# Patient Record
Sex: Female | Born: 1985 | Hispanic: Yes | Marital: Married | Smoking: Never smoker
Health system: Southern US, Community
[De-identification: ages and names within clinical notes are randomized; demographics above are authoritative.]

## PROBLEM LIST (undated history)

## (undated) DIAGNOSIS — N83209 Unspecified ovarian cyst, unspecified side: Secondary | ICD-10-CM

## (undated) DIAGNOSIS — T148XXA Other injury of unspecified body region, initial encounter: Secondary | ICD-10-CM

## (undated) HISTORY — PX: NO PAST SURGERIES: SHX2092

## (undated) HISTORY — DX: Unspecified ovarian cyst, unspecified side: N83.209

## (undated) HISTORY — DX: Other injury of unspecified body region, initial encounter: T14.8XXA

---
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
## 2005-02-17 ENCOUNTER — Emergency Department: Payer: Self-pay | Admitting: Unknown Physician Specialty

## 2006-08-24 ENCOUNTER — Emergency Department: Payer: Self-pay | Admitting: Emergency Medicine

## 2007-06-21 ENCOUNTER — Emergency Department: Payer: Self-pay | Admitting: Emergency Medicine

## 2008-05-26 ENCOUNTER — Encounter: Payer: Self-pay | Admitting: Family Medicine

## 2008-05-29 ENCOUNTER — Encounter: Payer: Self-pay | Admitting: Family Medicine

## 2008-06-05 ENCOUNTER — Observation Stay: Payer: Self-pay | Admitting: Obstetrics and Gynecology

## 2011-08-22 ENCOUNTER — Emergency Department: Payer: Self-pay | Admitting: Internal Medicine

## 2011-09-02 ENCOUNTER — Emergency Department: Payer: Self-pay | Admitting: Unknown Physician Specialty

## 2012-05-01 ENCOUNTER — Emergency Department: Payer: Self-pay | Admitting: Emergency Medicine

## 2012-05-01 LAB — URINALYSIS, COMPLETE
Bilirubin,UR: NEGATIVE
Glucose,UR: NEGATIVE mg/dL (ref 0–75)
Ketone: NEGATIVE
Nitrite: NEGATIVE
Ph: 5 (ref 4.5–8.0)
RBC,UR: <1 /HPF (ref 0–5)
Squamous Epithelial: 3
WBC UR: 2 /HPF (ref 0–5)

## 2012-05-01 LAB — CBC WITH DIFFERENTIAL/PLATELET
Basophil %: 0.3 %
Eosinophil #: 0.2 10*3/uL (ref 0.0–0.7)
HCT: 39.8 % (ref 35.0–47.0)
HGB: 13.5 g/dL (ref 12.0–16.0)
Lymphocyte #: 2.2 10*3/uL (ref 1.0–3.6)
Lymphocyte %: 29.7 %
MCHC: 33.9 g/dL (ref 32.0–36.0)
MCV: 92 fL (ref 80–100)
Monocyte #: 0.6 x10 3/mm (ref 0.2–0.9)
Monocyte %: 8.1 %
Neutrophil #: 4.4 10*3/uL (ref 1.4–6.5)
RBC: 4.31 10*6/uL (ref 3.80–5.20)

## 2012-05-01 LAB — COMPREHENSIVE METABOLIC PANEL
Albumin: 4.1 g/dL (ref 3.4–5.0)
Alkaline Phosphatase: 81 U/L (ref 50–136)
Anion Gap: 8 (ref 7–16)
Bilirubin,Total: 0.3 mg/dL (ref 0.2–1.0)
Chloride: 107 mmol/L (ref 98–107)
Co2: 24 mmol/L (ref 21–32)
Creatinine: 0.49 mg/dL — ABNORMAL LOW (ref 0.60–1.30)
EGFR (African American): >60
EGFR (Non-African Amer.): >60
Sodium: 139 mmol/L (ref 136–145)
Total Protein: 8.2 g/dL (ref 6.4–8.2)

## 2012-05-01 LAB — LIPASE, BLOOD: Lipase: 374 U/L (ref 73–393)

## 2012-05-01 LAB — PREGNANCY, URINE: Pregnancy Test, Urine: NEGATIVE m[IU]/mL

## 2012-05-09 ENCOUNTER — Emergency Department: Payer: Self-pay | Admitting: Orthopedic Surgery

## 2012-05-09 LAB — URINALYSIS, COMPLETE
Bilirubin,UR: NEGATIVE
Blood: NEGATIVE
Nitrite: NEGATIVE
Ph: 5 (ref 4.5–8.0)
Protein: NEGATIVE
Squamous Epithelial: 5

## 2012-09-07 ENCOUNTER — Emergency Department: Payer: Self-pay | Admitting: Emergency Medicine

## 2012-09-07 LAB — URINALYSIS, COMPLETE
Glucose,UR: NEGATIVE mg/dL (ref 0–75)
Leukocyte Esterase: NEGATIVE
Nitrite: NEGATIVE
Ph: 5 (ref 4.5–8.0)
Protein: NEGATIVE
RBC,UR: <1 /HPF (ref 0–5)
Specific Gravity: 1.011 (ref 1.003–1.030)
Squamous Epithelial: 1

## 2012-09-07 LAB — CBC
HGB: 13.4 g/dL (ref 12.0–16.0)
MCH: 31.1 pg (ref 26.0–34.0)
MCV: 93 fL (ref 80–100)
Platelet: 275 10*3/uL (ref 150–440)
RBC: 4.32 10*6/uL (ref 3.80–5.20)

## 2012-09-12 ENCOUNTER — Emergency Department: Payer: Self-pay | Admitting: Emergency Medicine

## 2012-09-12 LAB — URINALYSIS, COMPLETE
Bacteria: NONE SEEN
Bilirubin,UR: NEGATIVE
Glucose,UR: NEGATIVE mg/dL (ref 0–75)
Nitrite: NEGATIVE
Ph: 6 (ref 4.5–8.0)
Protein: NEGATIVE
RBC,UR: 15 /HPF (ref 0–5)
Specific Gravity: 1.016 (ref 1.003–1.030)
Squamous Epithelial: 2
WBC UR: 1 /HPF (ref 0–5)

## 2012-09-12 LAB — CBC
HGB: 13.7 g/dL (ref 12.0–16.0)
MCH: 31.3 pg (ref 26.0–34.0)
MCHC: 33.8 g/dL (ref 32.0–36.0)
MCV: 93 fL (ref 80–100)
RDW: 13.5 % (ref 11.5–14.5)

## 2013-04-07 ENCOUNTER — Emergency Department: Payer: Self-pay | Admitting: Emergency Medicine

## 2013-04-07 LAB — URINALYSIS, COMPLETE
Glucose,UR: NEGATIVE mg/dL (ref 0–75)
Nitrite: NEGATIVE
Ph: 5 (ref 4.5–8.0)
Squamous Epithelial: 3
WBC UR: 1 /HPF (ref 0–5)

## 2013-04-07 LAB — CBC: MCV: 92 fL (ref 80–100)

## 2013-04-07 LAB — COMPREHENSIVE METABOLIC PANEL
Albumin: 3.9 g/dL (ref 3.4–5.0)
Alkaline Phosphatase: 73 U/L (ref 50–136)
Anion Gap: 6 — ABNORMAL LOW (ref 7–16)
BUN: 8 mg/dL (ref 7–18)
Bilirubin,Total: 0.4 mg/dL (ref 0.2–1.0)
Calcium, Total: 8.8 mg/dL (ref 8.5–10.1)
Chloride: 105 mmol/L (ref 98–107)
Co2: 24 mmol/L (ref 21–32)
Potassium: 3.7 mmol/L (ref 3.5–5.1)
SGOT(AST): 14 U/L — ABNORMAL LOW (ref 15–37)
SGPT (ALT): 18 U/L (ref 12–78)
Sodium: 135 mmol/L — ABNORMAL LOW (ref 136–145)
Total Protein: 8.2 g/dL (ref 6.4–8.2)

## 2013-04-07 LAB — HCG, QUANTITATIVE, PREGNANCY: Beta Hcg, Quant.: 29669 m[IU]/mL — ABNORMAL HIGH

## 2013-07-03 ENCOUNTER — Emergency Department: Payer: Self-pay | Admitting: Emergency Medicine

## 2013-10-18 ENCOUNTER — Encounter: Payer: Self-pay | Admitting: Family Medicine

## 2013-10-27 ENCOUNTER — Encounter: Payer: Self-pay | Admitting: Family Medicine

## 2016-08-07 ENCOUNTER — Emergency Department
Admission: EM | Admit: 2016-08-07 | Discharge: 2016-08-07 | Disposition: A | Discharge status: Home / Self Care | Payer: Self-pay | Attending: Emergency Medicine | Admitting: Emergency Medicine

## 2016-08-07 ENCOUNTER — Encounter: Payer: Self-pay | Admitting: Emergency Medicine

## 2016-08-07 DIAGNOSIS — M62830 Muscle spasm of back: Secondary | ICD-10-CM | POA: Insufficient documentation

## 2016-08-07 MED ORDER — KETOROLAC TROMETHAMINE 30 MG/ML IJ SOLN
30.0000 mg | Freq: Once | INTRAMUSCULAR | Status: AC
  Administered 2016-08-07: 30 mg via INTRAMUSCULAR

## 2016-08-07 MED ORDER — ORPHENADRINE CITRATE 30 MG/ML IJ SOLN
INTRAMUSCULAR | Status: AC
  Administered 2016-08-07: 60 mg via INTRAMUSCULAR
  Filled 2016-08-07: qty 2

## 2016-08-07 MED ORDER — CYCLOBENZAPRINE HCL 10 MG PO TABS: 10.0000 mg | ORAL_TABLET | Freq: Three times a day (TID) | ORAL | 0 refills | Status: DC | PRN

## 2016-08-07 MED ORDER — NAPROXEN 500 MG PO TABS: 500.0000 mg | ORAL_TABLET | Freq: Two times a day (BID) | ORAL | 0 refills | Status: DC

## 2016-08-07 MED ORDER — ORPHENADRINE CITRATE 30 MG/ML IJ SOLN
60.0000 mg | Freq: Two times a day (BID) | INTRAMUSCULAR | Status: DC
  Administered 2016-08-07: 60 mg via INTRAMUSCULAR

## 2016-08-07 MED ORDER — KETOROLAC TROMETHAMINE 30 MG/ML IJ SOLN
INTRAMUSCULAR | Status: AC
  Administered 2016-08-07: 30 mg via INTRAMUSCULAR
  Filled 2016-08-07: qty 1

## 2016-08-07 NOTE — ED Triage Notes (Signed)
Pt c/o right lower back pain since this am; took Nyquil and Ibuprofen with no relief; denies injury; denies urinary s/s; nothing improves pain

## 2016-08-07 NOTE — ED Provider Notes (Signed)
[facility] Emergency Department Provider Note  ____________________________________________  Time seen: Approximately 8:23 PM  I have reviewed the triage vital signs and the nursing notes.   HISTORY  Chief Complaint Back Pain    HPI Shelby Montgomery is a 30 y.o. female , NAD, presents to the emergency department with several hour history of right middle back pain. States the pain began after she went to a laundromat to dry her clothing. States she went home and attempted to take a shower to alleviate the pain but states that he continue to worsen. Has applied over-the-counter icy hot cream and taken ibuprofen without relief. Has no radiation of the pain. Denies any saddle paresthesias or loss of bowel or bladder control. Has no numbness, wheeze, tingling. Denies abdominal pain, nausea, vomiting, dysuria, hematuria or changes in urinary or bowel habits. Has not noted any rashes or skin sores. Denies any injury, trauma or falls.   History reviewed. No pertinent past medical history.  There are no active problems to display for this patient.   History reviewed. No pertinent surgical history.  Prior to Admission medications   Medication Sig Start Date End Date Taking? Authorizing Provider  cyclobenzaprine (FLEXERIL) 10 MG tablet Take 1 tablet (10 mg total) by mouth 3 (three) times daily as needed for muscle spasms. 08/07/16   Levoy Geisen L Dnasia Gauna, PA-C  naproxen (NAPROSYN) 500 MG tablet Take 1 tablet (500 mg total) by mouth 2 (two) times daily with a meal. 08/07/16   Evon Lopezperez L Kodie Kishi, PA-C    Allergies Patient has no known allergies.  History reviewed. No pertinent family history.  Social History Social History  Substance Use Topics  . Smoking status: Never Smoker  . Smokeless tobacco: Never Used  . Alcohol use No     Review of Systems  Constitutional: No fever/chills Cardiovascular: No chest pain. Respiratory: No shortness of breath.  Gastrointestinal:  No abdominal pain.  No nausea, vomiting.  No diarrhea.  No constipation. Genitourinary: Negative for dysuria, hematuria. No urinary hesitancy, urgency or increased frequency. Musculoskeletal: Positive for right middle back pain without radiation.  Skin: Negative for rash, redness, swelling, bruising, skin sores. Neurological: Negative for numbness, weakness, tingling. No saddle paresthesias or loss of bowel or bladder control. 10-point ROS otherwise negative.  ____________________________________________   PHYSICAL EXAM:  VITAL SIGNS: ED Triage Vitals  Enc Vitals Group     BP 08/07/16 2010 132/88     Pulse Rate 08/07/16 2010 92     Resp 08/07/16 2010 18     Temp 08/07/16 2010 98.3 F (36.8 C)     Temp Source 08/07/16 2010 Oral     SpO2 08/07/16 2010 99 %     Weight 08/07/16 2011 130 lb (59 kg)     Height 08/07/16 2011 5\' 1"  (1.549 m)     Head Circumference --      Peak Flow --      Pain Score 08/07/16 2010 8     Pain Loc --      Pain Edu? --      Excl. in GC? --      Constitutional: Alert and oriented. Well appearing and in no acute distress but in pain. Eyes: Conjunctivae are normal.  Head: Atraumatic. Neck: No cervical spine tenderness to palpation. Supple with full range of motion. No trapezial muscle spasm. Hematological/Lymphatic/Immunilogical: No cervical lymphadenopathy. Cardiovascular: Normal rate, regular rhythm. Normal S1 and S2.  Good peripheral circulation. Respiratory: Normal respiratory effort without tachypnea or retractions.  Lungs CTAB with breath sounds noted in all lung fields. No wheeze, rhonchi, rales. Gastrointestinal: Soft and nontender without distention or guarding in all quadrants. Musculoskeletal: No central spinal tenderness about the thoracic, lumbar or sacral spinal regions. Tenderness to palpation about the right lateral, lower thoracic muscular region with mild muscle spasm noted. Range of motion of the back is decreased due to pain. Full range  of motion of bilateral upper and lower extremities without pain or difficulty. Neurologic:  Normal speech and language. No gross focal neurologic deficits are appreciated.  Skin:  Skin is warm, dry and intact. No rash, redness, swelling, bruising, skin sores noted. Psychiatric: Mood and affect are normal. Speech and behavior are normal. Patient exhibits appropriate insight and judgement.   ____________________________________________   LABS  None ____________________________________________  EKG  None ____________________________________________  RADIOLOGY  None ____________________________________________    PROCEDURES  Procedure(s) performed: None   Procedures   Medications  orphenadrine (NORFLEX) injection 60 mg (60 mg Intramuscular Given 08/07/16 2048)  ketorolac (TORADOL) 30 MG/ML injection 30 mg (30 mg Intramuscular Given 08/07/16 2052)     ____________________________________________   INITIAL IMPRESSION / ASSESSMENT AND PLAN / ED COURSE  Pertinent labs & imaging results that were available during my care of the patient were reviewed by me and considered in my medical decision making (see chart for details).  Clinical Course     Patient's diagnosis is consistent with Spasm of thoracic back muscle. Patient was given IM Toradol and Norflex while in the emergency department and tolerated well with notable decrease in pain. Patient will be discharged home with prescriptions for Flexeril and Naprosyn to take as directed. Patient is to follow up with [facility] if symptoms persist past this treatment course. Patient is given ED precautions to return to the ED for any worsening or new symptoms.    ____________________________________________  FINAL CLINICAL IMPRESSION(S) / ED DIAGNOSES  Final diagnoses:  Spasm of thoracic back muscle      NEW MEDICATIONS STARTED DURING THIS VISIT:  New Prescriptions   CYCLOBENZAPRINE (FLEXERIL) 10 MG TABLET     Take 1 tablet (10 mg total) by mouth 3 (three) times daily as needed for muscle spasms.   NAPROXEN (NAPROSYN) 500 MG TABLET    Take 1 tablet (500 mg total) by mouth 2 (two) times daily with a meal.         Hope PigeonJami L Laverne Hursey, PA-C 08/07/16 2150    Emily FilbertJonathan E Williams, MD 08/07/16 2220

## 2016-08-07 NOTE — ED Notes (Addendum)
Patient with complaint of right lower back pain that started this morning but became worse after doing laundry this afternoon.

## 2019-12-16 ENCOUNTER — Emergency Department: Payer: 59

## 2019-12-16 ENCOUNTER — Other Ambulatory Visit: Payer: Self-pay

## 2019-12-16 ENCOUNTER — Emergency Department
Admission: EM | Admit: 2019-12-16 | Discharge: 2019-12-16 | Disposition: A | Discharge status: Home / Self Care | Payer: 59 | Attending: Emergency Medicine | Admitting: Emergency Medicine

## 2019-12-16 DIAGNOSIS — R102 Pelvic and perineal pain: Secondary | ICD-10-CM | POA: Diagnosis not present

## 2019-12-16 DIAGNOSIS — R109 Unspecified abdominal pain: Secondary | ICD-10-CM | POA: Diagnosis present

## 2019-12-16 DIAGNOSIS — N83202 Unspecified ovarian cyst, left side: Secondary | ICD-10-CM | POA: Diagnosis not present

## 2019-12-16 LAB — CBC
HCT: 39.2 % (ref 36.0–46.0)
Hemoglobin: 13.1 g/dL (ref 12.0–15.0)
MCH: 31.4 pg (ref 26.0–34.0)
MCHC: 33.4 g/dL (ref 30.0–36.0)
MCV: 94 fL (ref 80.0–100.0)
Platelets: 275 10*3/uL (ref 150–400)
RBC: 4.17 MIL/uL (ref 3.87–5.11)
RDW: 13.1 % (ref 11.5–15.5)
WBC: 7.3 10*3/uL (ref 4.0–10.5)
nRBC: 0 % (ref 0.0–0.2)

## 2019-12-16 LAB — URINALYSIS, COMPLETE (UACMP) WITH MICROSCOPIC
Bilirubin Urine: NEGATIVE
Glucose, UA: NEGATIVE mg/dL
Hgb urine dipstick: NEGATIVE
Ketones, ur: NEGATIVE mg/dL
Leukocytes,Ua: NEGATIVE
Nitrite: NEGATIVE
Protein, ur: NEGATIVE mg/dL
Specific Gravity, Urine: 1.025 (ref 1.005–1.030)
pH: 5 (ref 5.0–8.0)

## 2019-12-16 LAB — COMPREHENSIVE METABOLIC PANEL
ALT: 15 U/L (ref 0–44)
AST: 18 U/L (ref 15–41)
Albumin: 4.1 g/dL (ref 3.5–5.0)
Alkaline Phosphatase: 58 U/L (ref 38–126)
Anion gap: 8 (ref 5–15)
BUN: 10 mg/dL (ref 6–20)
CO2: 21 mmol/L — ABNORMAL LOW (ref 22–32)
Calcium: 9.1 mg/dL (ref 8.9–10.3)
Chloride: 108 mmol/L (ref 98–111)
Creatinine, Ser: 0.62 mg/dL (ref 0.44–1.00)
GFR calc Af Amer: >60 mL/min (ref >60)
GFR calc non Af Amer: >60 mL/min (ref >60)
Glucose, Bld: 97 mg/dL (ref 70–99)
Potassium: 3.9 mmol/L (ref 3.5–5.1)
Sodium: 137 mmol/L (ref 135–145)
Total Bilirubin: 0.6 mg/dL (ref 0.3–1.2)
Total Protein: 7.5 g/dL (ref 6.5–8.1)

## 2019-12-16 LAB — POCT PREGNANCY, URINE: Preg Test, Ur: NEGATIVE

## 2019-12-16 LAB — LIPASE, BLOOD: Lipase: 31 U/L (ref 11–51)

## 2019-12-16 MED ORDER — KETOROLAC TROMETHAMINE 30 MG/ML IJ SOLN
30.0000 mg | Freq: Once | INTRAMUSCULAR | Status: AC
  Administered 2019-12-16: 19:00:00 30 mg via INTRAVENOUS
  Filled 2019-12-16: qty 1

## 2019-12-16 MED ORDER — FENTANYL CITRATE (PF) 100 MCG/2ML IJ SOLN
50.0000 ug | Freq: Once | INTRAMUSCULAR | Status: DC
  Filled 2019-12-16: qty 2

## 2019-12-16 MED ORDER — KETOROLAC TROMETHAMINE 10 MG PO TABS: 10.0000 mg | ORAL_TABLET | Freq: Three times a day (TID) | ORAL | 0 refills | Status: DC | PRN

## 2019-12-16 MED ORDER — SODIUM CHLORIDE 0.9% FLUSH: 3.0000 mL | Freq: Once | INTRAVENOUS | Status: DC

## 2019-12-16 MED ORDER — IOHEXOL 300 MG/ML  SOLN
100.0000 mL | Freq: Once | INTRAMUSCULAR | Status: AC | PRN
  Administered 2019-12-16: 100 mL via INTRAVENOUS
  Filled 2019-12-16: qty 100

## 2019-12-16 MED ORDER — FENTANYL CITRATE (PF) 100 MCG/2ML IJ SOLN
50.0000 ug | Freq: Once | INTRAMUSCULAR | Status: AC
  Administered 2019-12-16: 50 ug via INTRAVENOUS

## 2019-12-16 NOTE — ED Notes (Signed)
Pt in US at this time 

## 2019-12-16 NOTE — ED Triage Notes (Addendum)
Pt comes via POV from home with c/o left sided abdominal pain. Pt states this started yesterday morning.  Pt denies any N/V/D. Pt denies any urinary symptoms.

## 2019-12-16 NOTE — Discharge Instructions (Addendum)
Please seek medical attention for any high fevers, chest pain, shortness of breath, change in behavior, persistent vomiting, bloody stool or any other new or concerning symptoms.  

## 2019-12-16 NOTE — ED Provider Notes (Signed)
[facility] Emergency Department Provider Note   ____________________________________________   I have reviewed the triage vital signs and the nursing notes.   HISTORY  Chief Complaint Abdominal Pain   History limited by: Not Limited   HPI Shelby Montgomery is a 34 y.o. female who presents to the emergency department today because of concerns for left sided abdominal pain.  Patient states that the pain started yesterday morning.  The pain has been fairly constant since then.  It does somewhat come and go in waves.  She has not had any associated fevers, nausea or vomiting.  She has not noticed any abnormal vaginal discharge, change in urination or defecation.  The patient denies similar symptoms in the past.  Patient denies any history of abdominal surgeries.   Records reviewed. Per medical record review patient has a history of cesarean section.  History reviewed. No pertinent past medical history.  There are no problems to display for this patient.   History reviewed. No pertinent surgical history.  Prior to Admission medications   Medication Sig Start Date End Date Taking? Authorizing Provider  cyclobenzaprine (FLEXERIL) 10 MG tablet Take 1 tablet (10 mg total) by mouth 3 (three) times daily as needed for muscle spasms. 08/07/16   Hagler, Jami L, PA-C  naproxen (NAPROSYN) 500 MG tablet Take 1 tablet (500 mg total) by mouth 2 (two) times daily with a meal. 08/07/16   Hagler, Jami L, PA-C    Allergies Patient has no known allergies.  No family history on file.  Social History Social History   Tobacco Use  . Smoking status: Never Smoker  . Smokeless tobacco: Never Used  Substance Use Topics  . Alcohol use: No  . Drug use: No    Review of Systems Constitutional: No fever/chills Eyes: No visual changes. ENT: No sore throat. Cardiovascular: Denies chest pain. Respiratory: Denies shortness of breath. Gastrointestinal: Positive for left  sided abdominal pain.  Genitourinary: Negative for dysuria. Musculoskeletal: Negative for back pain. Skin: Negative for rash. Neurological: Negative for headaches, focal weakness or numbness.  ____________________________________________   PHYSICAL EXAM:  VITAL SIGNS: ED Triage Vitals  Enc Vitals Group     BP 12/16/19 1157 133/86     Pulse Rate 12/16/19 1157 82     Resp 12/16/19 1157 18     Temp 12/16/19 1157 98.4 F (36.9 C)     Temp src --      SpO2 12/16/19 1157 100 %     Weight 12/16/19 1158 130 lb (59 kg)     Height 12/16/19 1158 5\' 1"  (1.549 m)     Head Circumference --      Peak Flow --      Pain Score 12/16/19 1158 6   Constitutional: Alert and oriented.  Eyes: Conjunctivae are normal.  ENT      Head: Normocephalic and atraumatic.      Nose: No congestion/rhinnorhea.      Mouth/Throat: Mucous membranes are moist.      Neck: No stridor. Hematological/Lymphatic/Immunilogical: No cervical lymphadenopathy. Cardiovascular: Normal rate, regular rhythm.  No murmurs, rubs, or gallops.  Respiratory: Normal respiratory effort without tachypnea nor retractions. Breath sounds are clear and equal bilaterally. No wheezes/rales/rhonchi. Gastrointestinal: Soft and tender to palpation in the left upper and lower abdomen Genitourinary: Deferred Musculoskeletal: Normal range of motion in all extremities. No lower extremity edema. Neurologic:  Normal speech and language. No gross focal neurologic deficits are appreciated.  Skin:  Skin is warm, dry and  intact. No rash noted. Psychiatric: Mood and affect are normal. Speech and behavior are normal. Patient exhibits appropriate insight and judgment.  ____________________________________________    LABS (pertinent positives/negatives)  Lipase 31 CMP wnl except co2 21 CBC wbc 7.3, hgb 13.1, plt 275 UA cloudy, few bacteria, 0-5 rbc and wbc Upreg  negative ____________________________________________   EKG  None  ____________________________________________    RADIOLOGY  CT abd pel Left sided ovarian cyst, no other acute abnormality  US pelvis Left sided ovarian cyst  ____________________________________________   PROCEDURES  Procedures  ____________________________________________   INITIAL IMPRESSION / ASSESSMENT AND PLAN / ED COURSE  Pertinent labs & imaging results that were available during my care of the patient were reviewed by me and considered in my medical decision making (see chart for details).   Patient presented to the emergency department today because of concerns for left-sided abdominal pain that started yesterday morning.  Blood work here without any concerning leukocytosis.  Urine without findings consistent with significant fractional.  Did obtain a CT given that her tenderness was all on her left side.  This did not show any concerning abdominal infection although showed an ovarian cyst.  Ultrasound was performed which confirmed left-sided ovarian cyst but no signs of torsion or other complicating factors.  Patient did feel better with pain medication given here in the emergency department.  At this point I think is reasonable to discharge patient home.  Will give patient prescription for pain medication.  Discussed importance of follow-up.  ____________________________________________   FINAL CLINICAL IMPRESSION(S) / ED DIAGNOSES  Final diagnoses:  Left sided abdominal pain  Cyst of left ovary     Note: This dictation was prepared with Dragon dictation. Any transcriptional errors that result from this process are unintentional     Phineas Semen, MD 12/16/19 [phone]

## 2020-03-05 ENCOUNTER — Other Ambulatory Visit: Payer: Self-pay

## 2020-03-05 ENCOUNTER — Emergency Department: Payer: No Typology Code available for payment source

## 2020-03-05 ENCOUNTER — Encounter: Payer: Self-pay | Admitting: Emergency Medicine

## 2020-03-05 ENCOUNTER — Emergency Department
Admission: EM | Admit: 2020-03-05 | Discharge: 2020-03-05 | Disposition: A | Discharge status: Home / Self Care | Payer: No Typology Code available for payment source | Attending: Emergency Medicine | Admitting: Emergency Medicine

## 2020-03-05 DIAGNOSIS — Y9389 Activity, other specified: Secondary | ICD-10-CM | POA: Diagnosis not present

## 2020-03-05 DIAGNOSIS — S6991XA Unspecified injury of right wrist, hand and finger(s), initial encounter: Secondary | ICD-10-CM | POA: Diagnosis present

## 2020-03-05 DIAGNOSIS — Y990 Civilian activity done for income or pay: Secondary | ICD-10-CM | POA: Diagnosis not present

## 2020-03-05 DIAGNOSIS — W3182XA Contact with other commercial machinery, initial encounter: Secondary | ICD-10-CM | POA: Insufficient documentation

## 2020-03-05 DIAGNOSIS — S61001A Unspecified open wound of right thumb without damage to nail, initial encounter: Secondary | ICD-10-CM | POA: Diagnosis not present

## 2020-03-05 DIAGNOSIS — M79644 Pain in right finger(s): Secondary | ICD-10-CM

## 2020-03-05 DIAGNOSIS — Y9289 Other specified places as the place of occurrence of the external cause: Secondary | ICD-10-CM | POA: Insufficient documentation

## 2020-03-05 DIAGNOSIS — M7989 Other specified soft tissue disorders: Secondary | ICD-10-CM

## 2020-03-05 MED ORDER — TETANUS-DIPHTH-ACELL PERTUSSIS 5-2.5-18.5 LF-MCG/0.5 IM SUSP
0.5000 mL | Freq: Once | INTRAMUSCULAR | Status: AC
  Administered 2020-03-05: 0.5 mL via INTRAMUSCULAR
  Filled 2020-03-05: qty 0.5

## 2020-03-05 MED ORDER — OXYCODONE-ACETAMINOPHEN 5-325 MG PO TABS
1.0000 | ORAL_TABLET | ORAL | Status: AC | PRN
  Administered 2020-03-05 (×2): 1 via ORAL
  Filled 2020-03-05 (×2): qty 1

## 2020-03-05 MED ORDER — BACITRACIN-NEOMYCIN-POLYMYXIN 400-5-5000 EX OINT
TOPICAL_OINTMENT | Freq: Once | CUTANEOUS | Status: AC
  Filled 2020-03-05: qty 1

## 2020-03-05 MED ORDER — HYDROCODONE-ACETAMINOPHEN 5-325 MG PO TABS: 1.0000 | ORAL_TABLET | ORAL | 0 refills | Status: DC | PRN

## 2020-03-05 NOTE — ED Triage Notes (Signed)
Pt reports got her hand stuck in a machine at work, now painful with laceration to right hand thumb and pain to hand.

## 2020-03-05 NOTE — Discharge Instructions (Addendum)
You were seen today for right thumb pain, swelling and skin avulsion secondary to trauma.  You received a tetanus injection today.  Your x-ray was negative for acute fracture  We cleansed your wound and applied triple antibiotic ointment, and placed you in a splint for comfort.  Please keep the wound clean and dry.  You may apply triple antibiotic ointment daily.  Please follow-up with your PCP for reevaluation tomorrow if able.

## 2020-03-05 NOTE — ED Notes (Signed)
Pt unable to sign E-signature due to signature pad malfunction. Pt verbalized understanding of d/c instructions and had no additional questions or concerns for this RN or provider. Pt left with d/c instructions and gathered all personal belongings from room and removed them prior to ED departure.   

## 2020-03-05 NOTE — ED Provider Notes (Addendum)
[facility] Emergency Department Provider Note ____________________________________________  Time seen: 1800  I have reviewed the triage vital signs and the nursing notes.  HISTORY  Chief Complaint  Hand Injury   HPI Shelby Montgomery is a 34 y.o. female presents to the ER with complaint of right thumb pain/laceration.  She reports she was at work earlier and got her thumb caught in the machine.  She describes the pain as throbbing, pressure.  The pain does not radiate but she does have numbness and weakness of her right thumb. She denies tingling. She initially presented to [facility] and was advised to present to the ER.  History reviewed. No pertinent past medical history.  There are no problems to display for this patient.   History reviewed. No pertinent surgical history.  Prior to Admission medications   Medication Sig Start Date End Date Taking? Authorizing Provider  cyclobenzaprine (FLEXERIL) 10 MG tablet Take 1 tablet (10 mg total) by mouth 3 (three) times daily as needed for muscle spasms. 08/07/16   Hagler, Jami L, PA-C  HYDROcodone-acetaminophen (NORCO/VICODIN) 5-325 MG tablet Take 1 tablet by mouth every 4 (four) hours as needed for moderate pain. 03/05/20 03/05/21  Lorre Munroe, NP  ketorolac (TORADOL) 10 MG tablet Take 1 tablet (10 mg total) by mouth every 8 (eight) hours as needed for severe pain. 12/16/19   Phineas Semen, MD  naproxen (NAPROSYN) 500 MG tablet Take 1 tablet (500 mg total) by mouth 2 (two) times daily with a meal. 08/07/16   Hagler, Jami L, PA-C    Allergies Patient has no known allergies.  No family history on file.  Social History Social History   Tobacco Use  . Smoking status: Never Smoker  . Smokeless tobacco: Never Used  Substance Use Topics  . Alcohol use: No  . Drug use: No    Review of Systems  Constitutional: Negative for fever, chills or body aches. Cardiovascular: Negative for chest pain or chest  tightness. Respiratory: Negative for cough or shortness of breath. Musculoskeletal: Positive for right thumb pain, swelling. Skin: Positive for laceration right thumb. Neurological: Positive for numbness and weakness of right thumb. Negative for headaches, tingling. ____________________________________________  PHYSICAL EXAM:  VITAL SIGNS: ED Triage Vitals  Enc Vitals Group     BP 03/05/20 1548 (!) 133/100     Pulse Rate 03/05/20 1548 78     Resp 03/05/20 1548 18     Temp 03/05/20 1548 98.7 F (37.1 C)     Temp Source 03/05/20 1548 Oral     SpO2 03/05/20 1548 100 %     Weight 03/05/20 1543 145 lb (65.8 kg)     Height 03/05/20 1543 5\' 1"  (1.549 m)     Head Circumference --      Peak Flow --      Pain Score 03/05/20 1543 8     Pain Loc --      Pain Edu? --      Excl. in GC? --     Constitutional: Alert and oriented. Appears in pain but in no distress. Cardiovascular: Normal rate, regular rhythm.  Radial pulses 2+ bilaterally. Unable to check cap refill secondary to artificial nails. Respiratory: Normal respiratory effort. No wheezes/rales/rhonchi. Musculoskeletal: Decreased active flexion and extension of the right thumb secondary to pain.  Normal passive flexion extension of the right thumb.  1+ swelling noted over the distal thumb.  Neurologic: Decreased sensation of the right thumb. Skin: Avulsed skin noted of  the right medial thumb. ____________________________________________   RADIOLOGY  Imaging Orders     DG Hand Complete Right IMPRESSION: No acute osseous abnormality   ____________________________________________   INITIAL IMPRESSION / ASSESSMENT AND PLAN / ED COURSE  Right Thumb Pain, Swelling, Skin Avulsion secondary to Trauma:  Percocet 5-325 mg PO x 1 Xray right thumb negative for acute fracture Skin avulsion unable to be repaired- cleansed with NS, covered with Triple Antibiotic Ointment, placed in splint for comfort Tdap today RX for Hydrocodone 5-325  mg tab TID prn She will follow up with her PCP tomorrow  I reviewed the patient's prescription history over the last 12 months in the multi-state controlled substances database(s) that includes [place], [place], [place], [place], [place], [place], [place], [place], [place], [place], [place], [place], [place], and [place].  Results were notable for no controlled substances. ____________________________________________  FINAL CLINICAL IMPRESSION(S) / ED DIAGNOSES  Final diagnoses:  Pain of right thumb  Swelling of right thumb  Avulsion of skin of right thumb, initial encounter       Lorre Munroe, NP 03/05/20 1833    Lorre Munroe, NP 03/05/20 1835    Lorre Munroe, NP 03/05/20 1846    Emily Filbert, MD 03/05/20 2015

## 2020-06-15 ENCOUNTER — Encounter: Payer: Self-pay | Admitting: Emergency Medicine

## 2020-06-15 ENCOUNTER — Emergency Department
Admission: EM | Admit: 2020-06-15 | Discharge: 2020-06-15 | Disposition: A | Discharge status: Home / Self Care | Payer: 59 | Attending: Emergency Medicine | Admitting: Emergency Medicine

## 2020-06-15 ENCOUNTER — Other Ambulatory Visit: Payer: Self-pay

## 2020-06-15 DIAGNOSIS — X58XXXA Exposure to other specified factors, initial encounter: Secondary | ICD-10-CM | POA: Insufficient documentation

## 2020-06-15 DIAGNOSIS — S3992XA Unspecified injury of lower back, initial encounter: Secondary | ICD-10-CM | POA: Diagnosis present

## 2020-06-15 DIAGNOSIS — S39012A Strain of muscle, fascia and tendon of lower back, initial encounter: Secondary | ICD-10-CM | POA: Diagnosis not present

## 2020-06-15 LAB — URINALYSIS, COMPLETE (UACMP) WITH MICROSCOPIC
Bacteria, UA: NONE SEEN
Bilirubin Urine: NEGATIVE
Glucose, UA: NEGATIVE mg/dL
Ketones, ur: NEGATIVE mg/dL
Leukocytes,Ua: NEGATIVE
Nitrite: NEGATIVE
Protein, ur: NEGATIVE mg/dL
Specific Gravity, Urine: 1.027 (ref 1.005–1.030)
pH: 5 (ref 5.0–8.0)

## 2020-06-15 LAB — POCT PREGNANCY, URINE: Preg Test, Ur: NEGATIVE

## 2020-06-15 MED ORDER — METHOCARBAMOL 500 MG PO TABS: 500.0000 mg | ORAL_TABLET | Freq: Four times a day (QID) | ORAL | 0 refills | Status: DC

## 2020-06-15 MED ORDER — ONDANSETRON 4 MG PO TBDP
4.0000 mg | ORAL_TABLET | Freq: Once | ORAL | Status: AC
  Administered 2020-06-15: 4 mg via ORAL
  Filled 2020-06-15: qty 1

## 2020-06-15 MED ORDER — HYDROCODONE-ACETAMINOPHEN 5-325 MG PO TABS: 1.0000 | ORAL_TABLET | Freq: Four times a day (QID) | ORAL | 0 refills | Status: DC | PRN

## 2020-06-15 MED ORDER — KETOROLAC TROMETHAMINE 30 MG/ML IJ SOLN
30.0000 mg | Freq: Once | INTRAMUSCULAR | Status: AC
  Administered 2020-06-15: 30 mg via INTRAMUSCULAR
  Filled 2020-06-15: qty 1

## 2020-06-15 MED ORDER — OXYCODONE-ACETAMINOPHEN 7.5-325 MG PO TABS
1.0000 | ORAL_TABLET | Freq: Once | ORAL | Status: AC
  Administered 2020-06-15: 1 via ORAL
  Filled 2020-06-15: qty 1

## 2020-06-15 MED ORDER — METHOCARBAMOL 500 MG PO TABS
1000.0000 mg | ORAL_TABLET | Freq: Once | ORAL | Status: AC
  Administered 2020-06-15: 1000 mg via ORAL
  Filled 2020-06-15: qty 2

## 2020-06-15 MED ORDER — NAPROXEN 500 MG PO TABS: 500.0000 mg | ORAL_TABLET | Freq: Two times a day (BID) | ORAL | 0 refills | Status: DC

## 2020-06-15 NOTE — Discharge Instructions (Signed)
Follow-up with your primary care provider if any continued problems or concerns.  Begin taking medication medication as directed.  Be aware that the hydrocodone and the methocarbamol could cause drowsiness and increase your risk for injury.  Do not drive or operate machinery while taking this medication.  Use ice or heat to your back as needed for discomfort.  If any severe worsening of your symptoms such as loss of bowel or bladder control return to the emergency department.

## 2020-06-15 NOTE — ED Provider Notes (Signed)
[facility] Emergency Department Provider Note  ____________________________________________   First MD Initiated Contact with Patient 06/15/20 [phone]     (approximate)  I have reviewed the triage vital signs and the nursing notes.   HISTORY  Chief Complaint Back Pain   HPI Shelby Montgomery is a 34 y.o. female presents to the ED with complaint of low back pain that started last evening.  Patient states that she mostly has pain on the right side which is worse than the left.  She is unaware of any injury.  She states that she had problems with her back 2 years ago which cleared with medication.  She is unaware of any urinary symptoms or history of kidney stones.      History reviewed. No pertinent past medical history.  There are no problems to display for this patient.   History reviewed. No pertinent surgical history.  Prior to Admission medications   Medication Sig Start Date End Date Taking? Authorizing Provider  HYDROcodone-acetaminophen (NORCO/VICODIN) 5-325 MG tablet Take 1 tablet by mouth every 6 (six) hours as needed for moderate pain. 06/15/20   Tommi Rumps, PA-C  methocarbamol (ROBAXIN) 500 MG tablet Take 1 tablet (500 mg total) by mouth 4 (four) times daily. 06/15/20   Tommi Rumps, PA-C  naproxen (NAPROSYN) 500 MG tablet Take 1 tablet (500 mg total) by mouth 2 (two) times daily with a meal. 06/15/20   Tommi Rumps, PA-C    Allergies Patient has no known allergies.  No family history on file.  Social History Social History   Tobacco Use  . Smoking status: Never Smoker  . Smokeless tobacco: Never Used  Substance Use Topics  . Alcohol use: No  . Drug use: No    Review of Systems Constitutional: No fever/chills Eyes: No visual changes. ENT: No sore throat. Cardiovascular: Denies chest pain. Respiratory: Denies shortness of breath. Gastrointestinal: No abdominal pain.  No nausea, no vomiting.  No diarrhea.  No  constipation. Genitourinary: Negative for dysuria. Musculoskeletal: Positive for low back pain. Skin: Negative for rash. Neurological: Negative for headaches, focal weakness or numbness. ____________________________________________   PHYSICAL EXAM:  VITAL SIGNS: ED Triage Vitals  Enc Vitals Group     BP 06/15/20 0730 130/90     Pulse Rate 06/15/20 0730 85     Resp 06/15/20 0730 16     Temp 06/15/20 0730 99 F (37.2 C)     Temp Source 06/15/20 0730 Oral     SpO2 06/15/20 0730 100 %     Weight 06/15/20 0731 145 lb 1 oz (65.8 kg)     Height 06/15/20 0731 5\' 1"  (1.549 m)     Head Circumference --      Peak Flow --      Pain Score 06/15/20 0730 10     Pain Loc --      Pain Edu? --      Excl. in GC? --     Constitutional: Alert and oriented. Well appearing and in no acute distress. Eyes: Conjunctivae are normal. PERRL. EOMI. Head: Atraumatic. Nose: No congestion/rhinnorhea. Neck: No stridor.  No cervical tenderness on palpation posteriorly.  Range of motion without restriction. Cardiovascular: Normal rate, regular rhythm. Grossly normal heart sounds.  Good peripheral circulation. Respiratory: Normal respiratory effort.  No retractions. Lungs CTAB. Gastrointestinal: Soft and nontender. No distention.  No CVA tenderness. Musculoskeletal: On examination of the back there is no tenderness on palpation of the thoracic or point tenderness  noted on the lumbar spine.  No step-offs noted.  There is moderate tenderness on the right SI joint and lower lumbar area paravertebral muscles on the right however left paravertebral muscles are also slightly tender to palpation.  Moderate muscle spasms are noted which restricts patient's range of motion.  Good muscle strength lower extremities. Neurologic:  Normal speech and language. No gross focal neurologic deficits are appreciated.  Skin:  Skin is warm, dry and intact. No rash noted. Psychiatric: Mood and affect are normal. Speech and behavior are  normal.  ____________________________________________   LABS (all labs ordered are listed, but only abnormal results are displayed)  Labs Reviewed  URINALYSIS, COMPLETE (UACMP) WITH MICROSCOPIC - Abnormal; Notable for the following components:      Result Value   Color, Urine YELLOW (*)    APPearance HAZY (*)    Hgb urine dipstick SMALL (*)    All other components within normal limits  POC URINE PREG, ED  POCT PREGNANCY, URINE     PROCEDURES  Procedure(s) performed (including Critical Care):  Procedures   ____________________________________________   INITIAL IMPRESSION / ASSESSMENT AND PLAN / ED COURSE  As part of my medical decision making, I reviewed the following data within the electronic MEDICAL RECORD NUMBER Notes from prior ED visits and [place] Controlled Substance Database  34 year old female presents to the ED with complaint of low back pain that started last evening.  Patient denies any recent injuries or situations where she has had to lift, push or pull.  She does report that she did have problems with her back 2 years ago which was similar to this.  She has never had back surgery.  She has no symptoms that are suggestive of cauda equina.  Patient was given Zofran and Percocet while waiting for her urinalysis.  She was made aware that her tests were negative for any kidney infection or suggestive for kidney stone.  Patient was given Toradol 30 mg IM and 1000 mg of methocarbamol p.o.  Prior to discharge patient was able to move much easier and was more comfortable.  She will remain on naproxen 500 mg twice daily with food, Norco every 6 hours as needed for pain and methocarbamol 1 tablet 4 times a day for his muscle spasms.  Patient is aware that she cannot take these medications while driving or operating machinery.  Note for work was given to her.  ____________________________________________   FINAL CLINICAL IMPRESSION(S) / ED DIAGNOSES  Final diagnoses:  Strain of  lumbar region, initial encounter     ED Discharge Orders         Ordered    methocarbamol (ROBAXIN) 500 MG tablet  4 times daily        06/15/20 1016    HYDROcodone-acetaminophen (NORCO/VICODIN) 5-325 MG tablet  Every 6 hours PRN        06/15/20 1016    naproxen (NAPROSYN) 500 MG tablet  2 times daily with meals        06/15/20 1016          *Please note:  Murrel Ruvid Sweeten was evaluated in Emergency Department on 06/15/2020 for the symptoms described in the history of present illness. She was evaluated in the context of the global COVID-19 pandemic, which necessitated consideration that the patient might be at risk for infection with the SARS-CoV-2 virus that causes COVID-19. Institutional protocols and algorithms that pertain to the evaluation of patients at risk for COVID-19 are in a state of rapid change  based on information released by regulatory bodies including the CDC and federal and state organizations. These policies and algorithms were followed during the patient's care in the ED.  Some ED evaluations and interventions may be delayed as a result of limited staffing during and the pandemic.*   Note:  This document was prepared using Dragon voice recognition software and may include unintentional dictation errors.    Tommi Rumps, PA-C 06/15/20 1343    Gilles Chiquito, MD 06/15/20 [phone]

## 2020-06-15 NOTE — ED Triage Notes (Signed)
C/O lower back pain since past night.  States right side hurts worse than left.  Denies injury.

## 2020-12-08 ENCOUNTER — Other Ambulatory Visit (HOSPITAL_COMMUNITY): Payer: Self-pay | Admitting: Physician Assistant

## 2020-12-08 DIAGNOSIS — R1031 Right lower quadrant pain: Secondary | ICD-10-CM

## 2021-01-05 ENCOUNTER — Other Ambulatory Visit: Payer: Self-pay

## 2021-01-05 ENCOUNTER — Ambulatory Visit
Admission: RE | Admit: 2021-01-05 | Discharge: 2021-01-05 | Disposition: A | Discharge status: Home / Self Care | Payer: BC Managed Care – PPO | Source: Ambulatory Visit | Attending: Physician Assistant | Admitting: Physician Assistant

## 2021-01-05 DIAGNOSIS — R1031 Right lower quadrant pain: Secondary | ICD-10-CM | POA: Diagnosis not present

## 2021-01-05 DIAGNOSIS — N83201 Unspecified ovarian cyst, right side: Secondary | ICD-10-CM | POA: Diagnosis not present

## 2021-02-24 DIAGNOSIS — H00019 Hordeolum externum unspecified eye, unspecified eyelid: Secondary | ICD-10-CM | POA: Diagnosis not present

## 2021-10-31 IMAGING — US US PELVIS COMPLETE WITH TRANSVAGINAL
1 series · 13 of 25 positions shown · non-contrast
Comparison: Prior CT from earlier the same day.

CLINICAL DATA: Initial evaluation for left-sided abdominal pain for
1 day.



[Series 1: us pelvic complete with transvaginal · 13 of 110 slices shown]
[im 1/110]
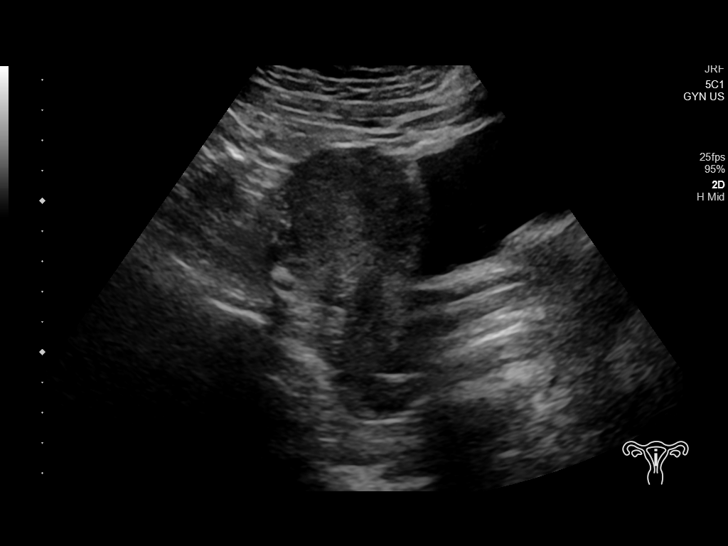
[im 10/110]
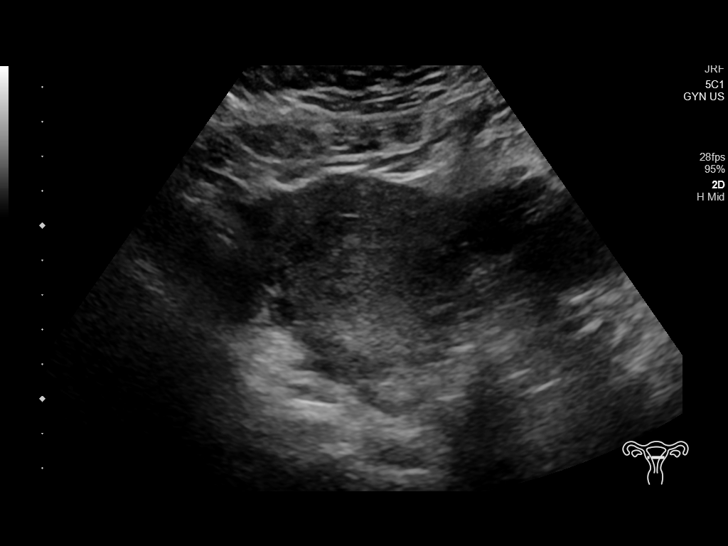
[im 19/110]
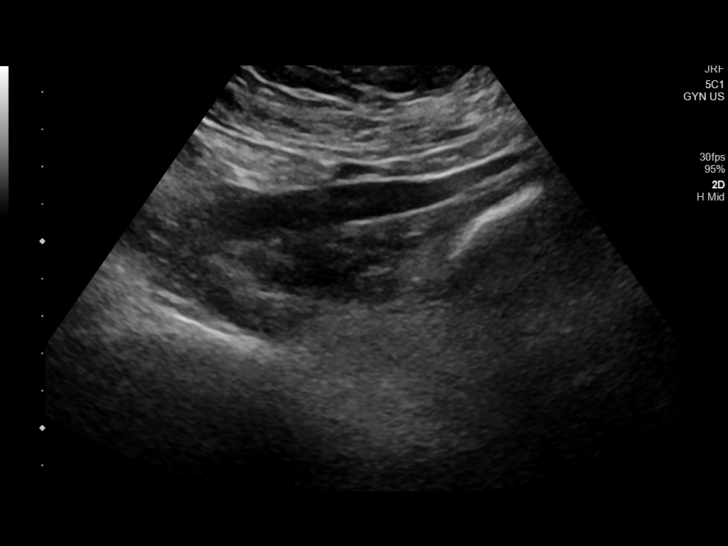
[im 28/110]
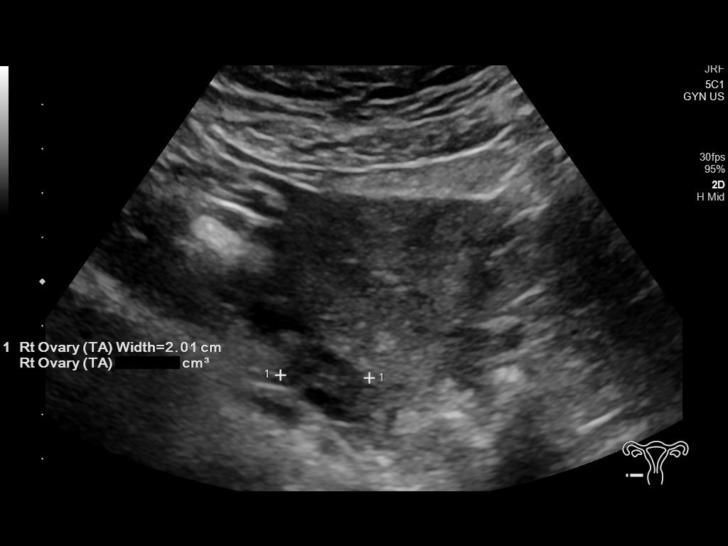
[im 37/110]
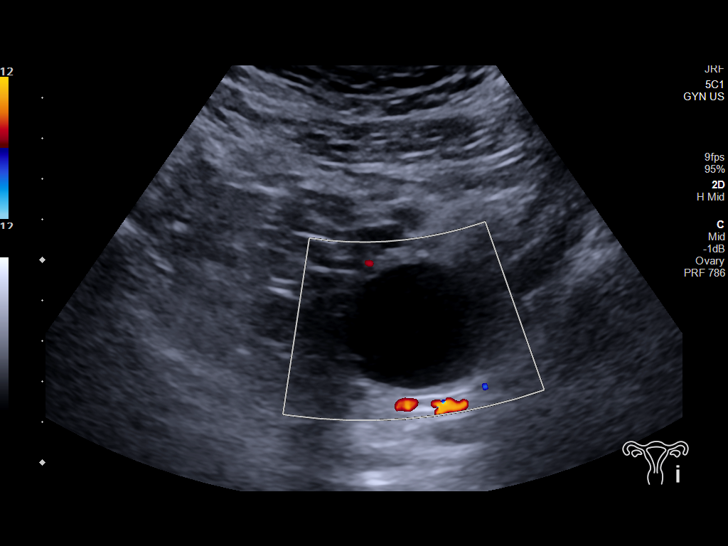
[im 46/110]
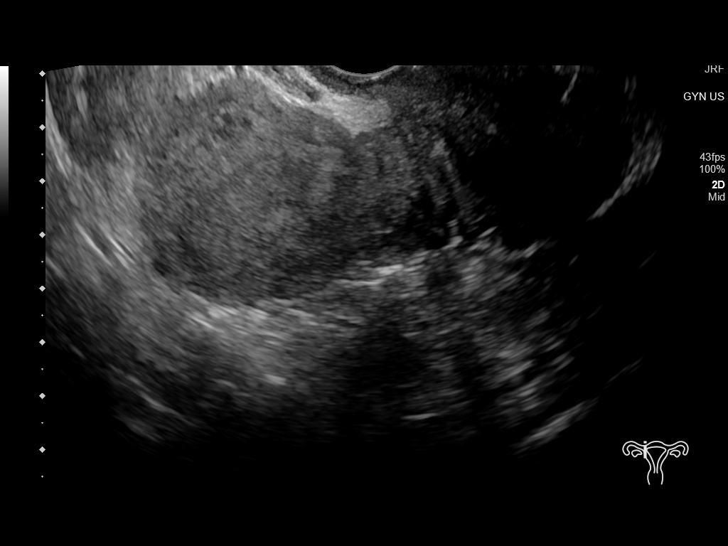
[im 55/110]
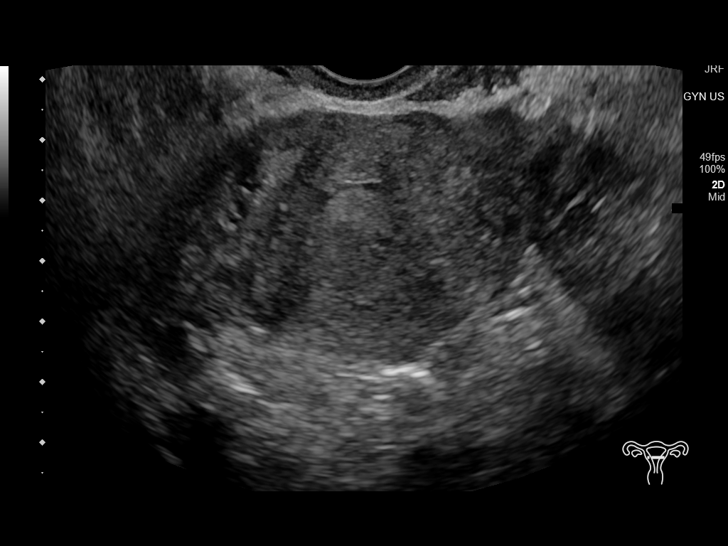
[im 64/110]
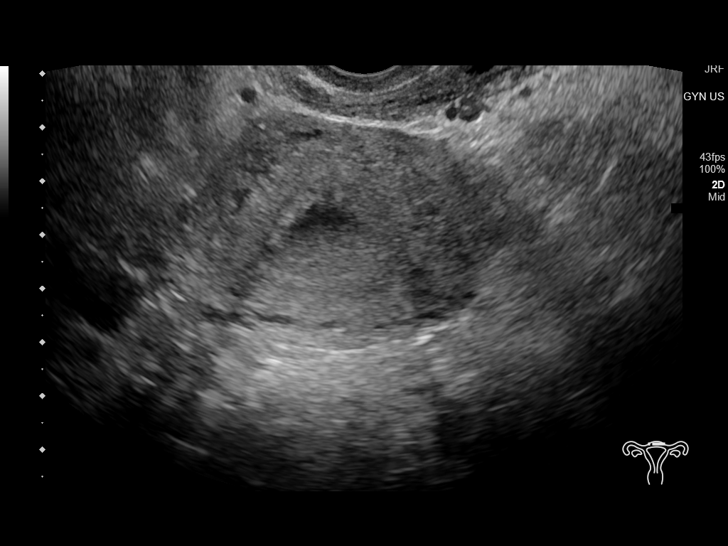
[im 73/110]
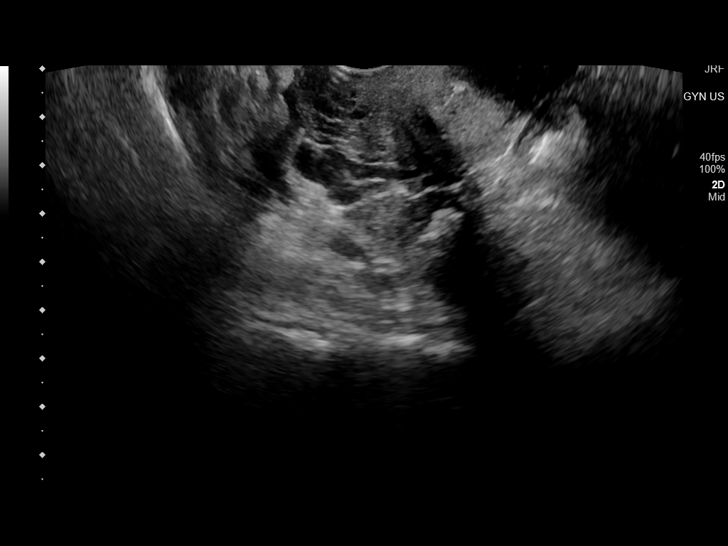
[im 82/110]
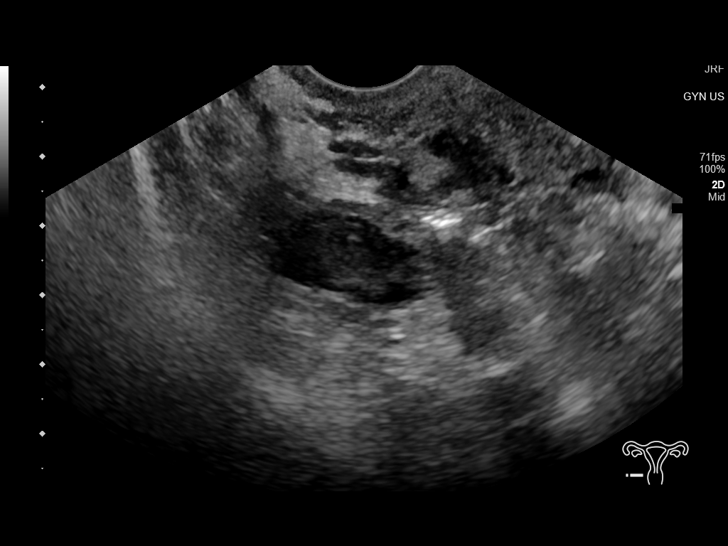
[im 91/110]
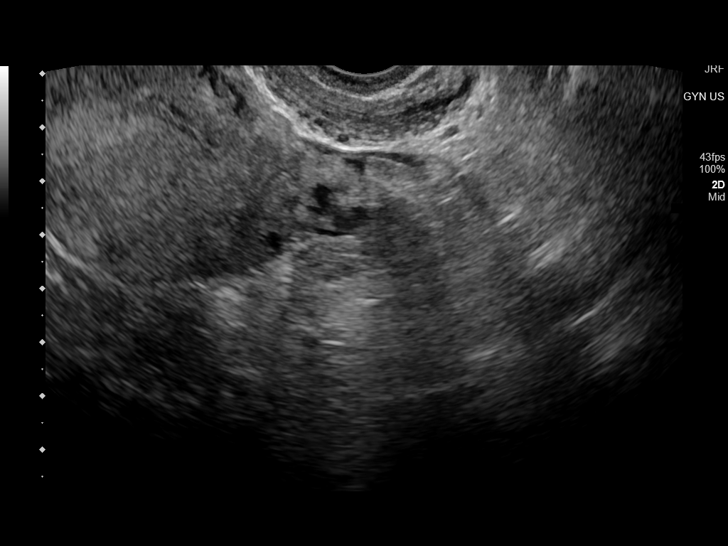
[im 100/110]
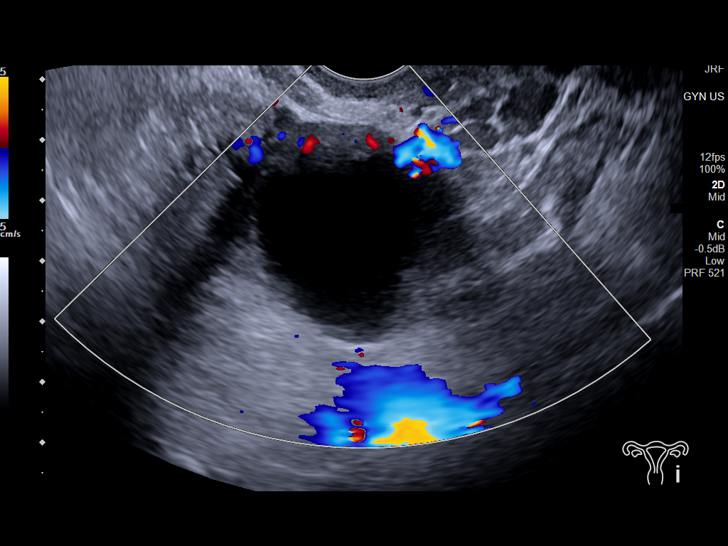
[im 110/110]
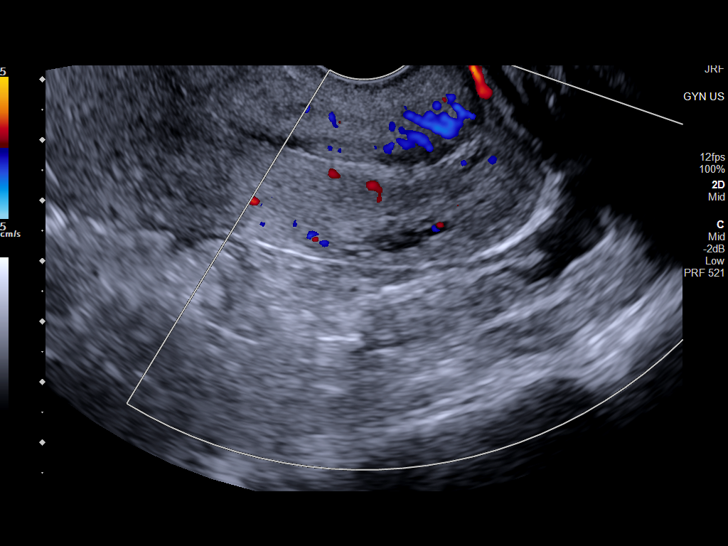

[13 of 25 positions shown; findings below may reference images not displayed]

FINDINGS: Uterus

Measurements: 10.1 x 5.0 x 5.7 cm = volume: 52 mL. No fibroids or
other mass visualized.

Endometrium

Thickness: 9.1 mm. No focal abnormality visualized. Small amount of
simple anechoic fluid noted within the endometrial cavity.

Right ovary

Measurements: 2.7 x 1.5 x 2.3 cm = volume: 5.0 mL. Normal
appearance/no adnexal mass.

Left ovary

Measurements: 4.0 x 3.2 x 3.4 cm = volume: 23 mL. 2.9 x 2.8 x 2.9 cm
simple cyst. No internal complexity, vascularity, or solid
component.

Other findings

Small volume free fluid within the pelvis, presumably physiologic.
IMPRESSION: 1. 3 cm simple left ovarian cyst, most consistent with a physiologic
follicular cyst. This has benign characteristics and is a common
finding in premenopausal females. No imaging follow up is required.
This follows consensus guidelines: Simple Adnexal Cysts: SRU
Consensus Conference Update on Follow-up and Reporting. Radiology
2. Otherwise unremarkable and normal pelvic ultrasound for age.

## 2021-10-31 IMAGING — CT CT ABD-PELV W/ CM
2 of 4 series · 16 of 46 positions shown, 18 images · IV contrast (APPLIED)
Comparison: None.

CLINICAL DATA: Left lower quadrant pain yesterday

EXAM:
CT ABDOMEN AND PELVIS WITH CONTRAST
TECHNIQUE: Multidetector CT imaging of the abdomen and pelvis was performed
using the standard protocol following bolus administration of
intravenous contrast.
CONTRAST:  100mL OMNIPAQUE IOHEXOL 300 MG/ML  SOLN

[Series 2: routine abd/pel with · axial · 0.70mm/px · z∈[-835,-405]mm · 13 of 96 slices shown, 15 images]
[im 5/96  soft-tissue]
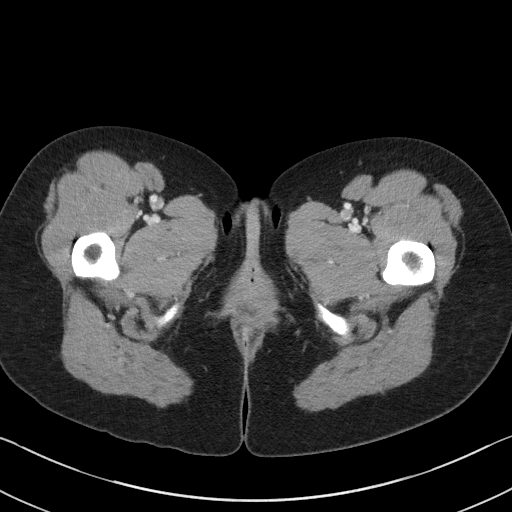
[im 5/96  bone]
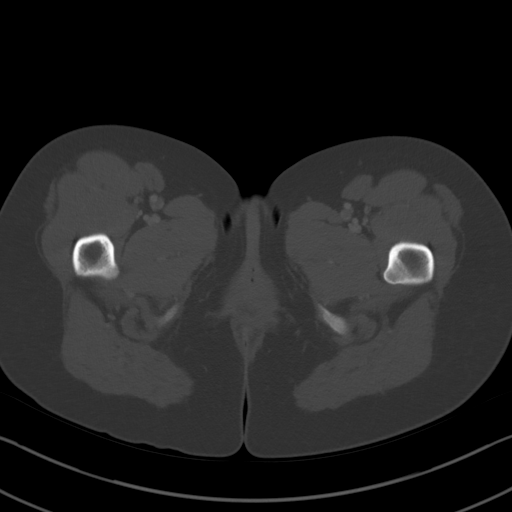
[im 13/96  soft-tissue]
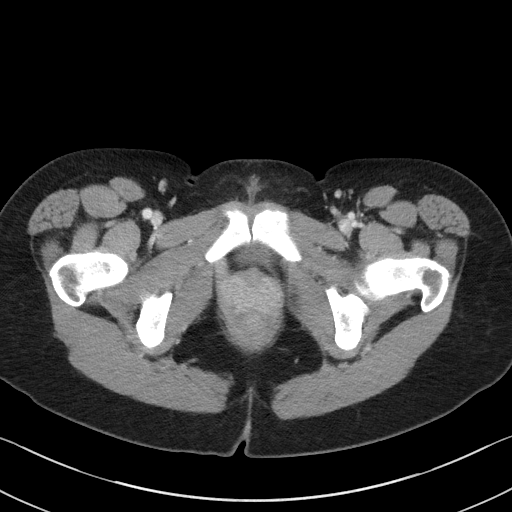
[im 21/96  soft-tissue]
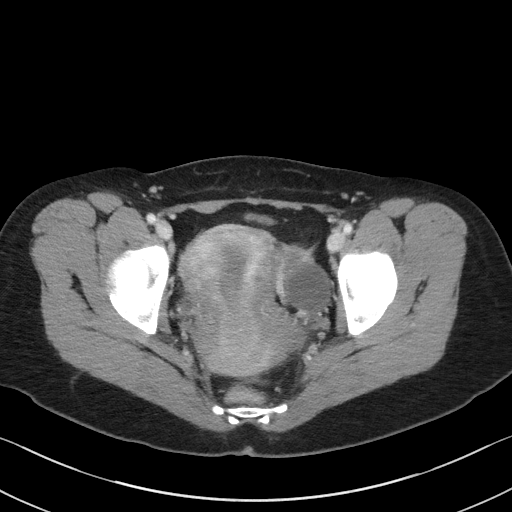
[im 25/96  soft-tissue]
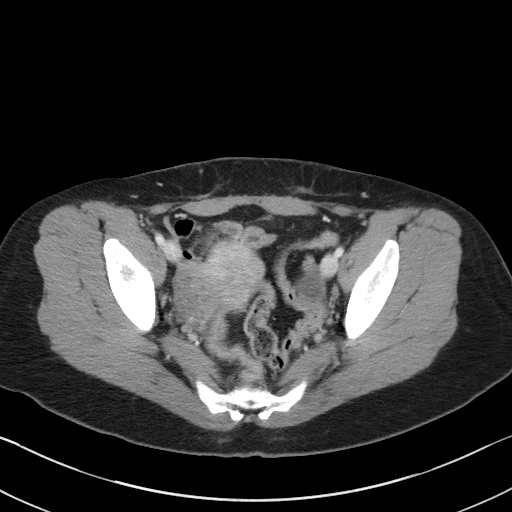
[im 34/96  soft-tissue]
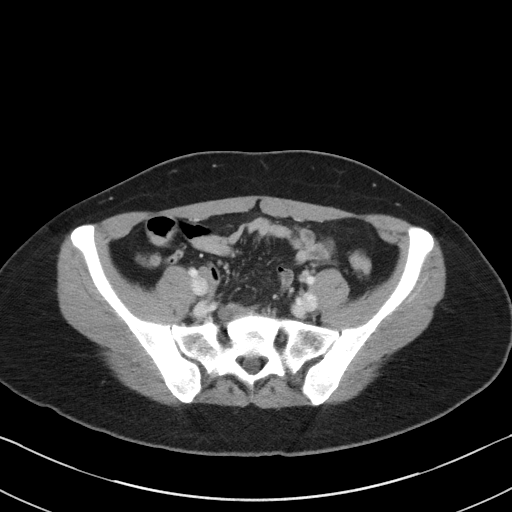
[im 42/96  soft-tissue]
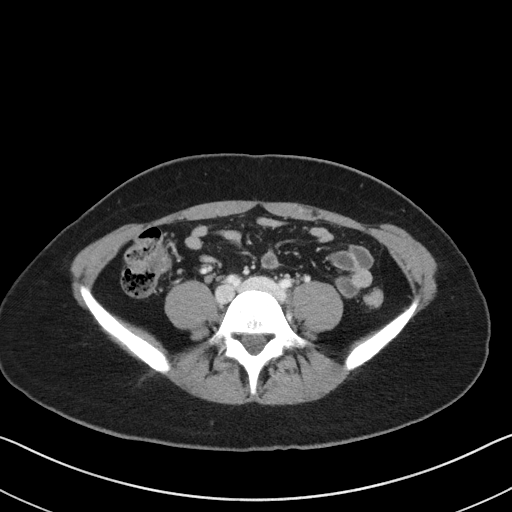
[im 50/96  soft-tissue]
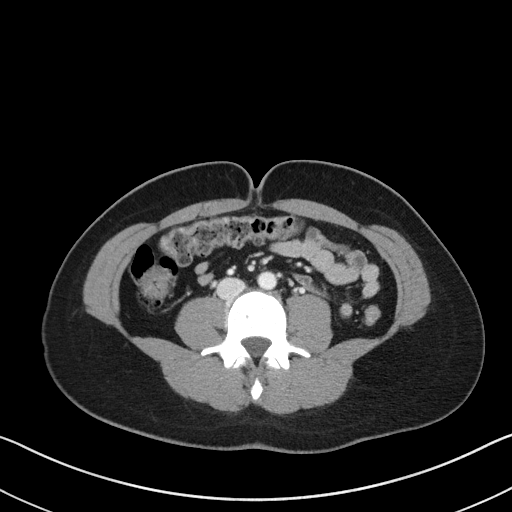
[im 54/96  soft-tissue]
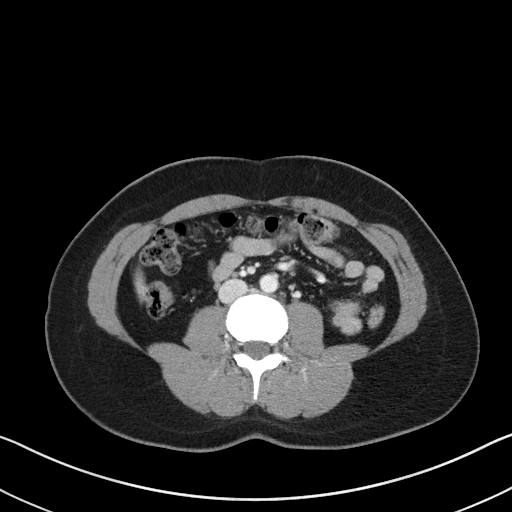
[im 62/96  soft-tissue]
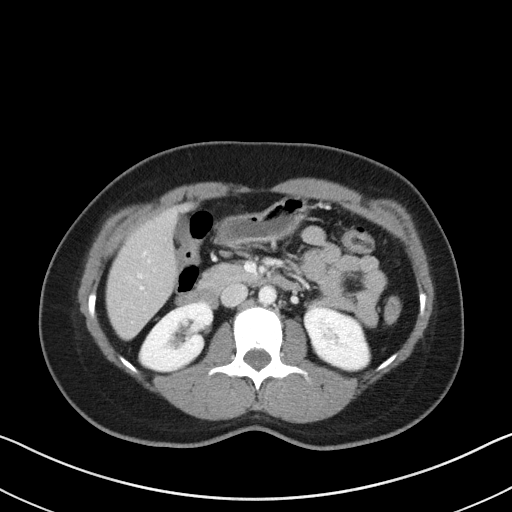
[im 62/96  bone]
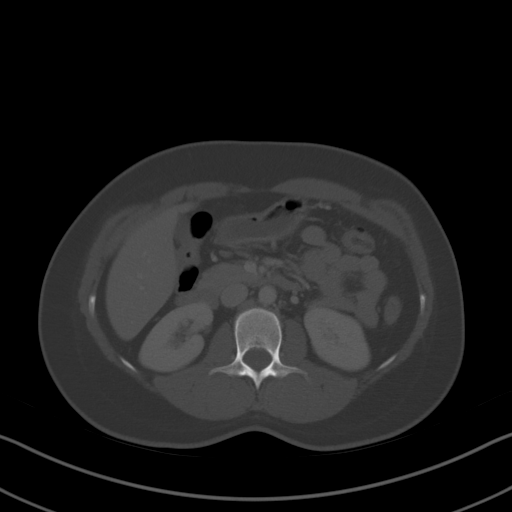
[im 71/96  soft-tissue]
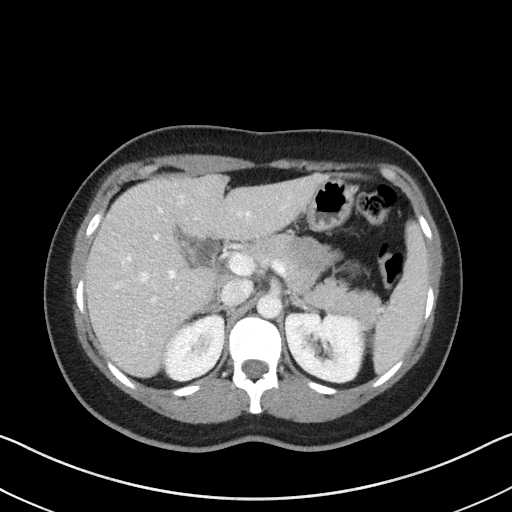
[im 75/96  soft-tissue]
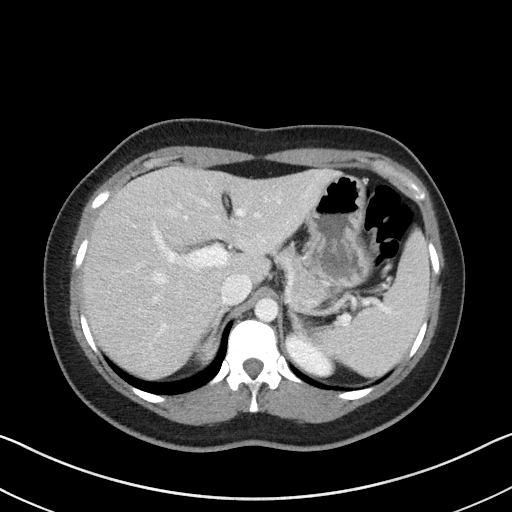
[im 83/96  soft-tissue]
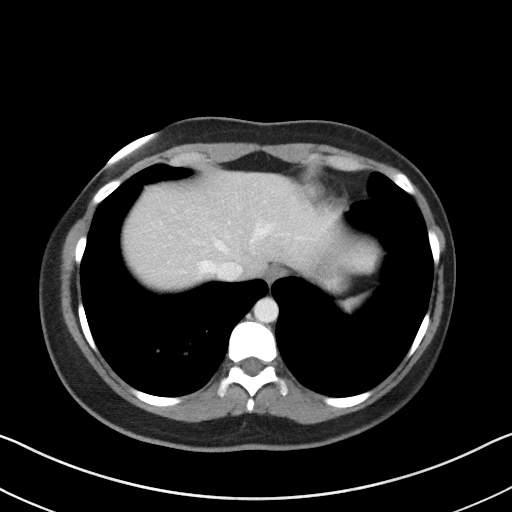
[im 91/96  soft-tissue]
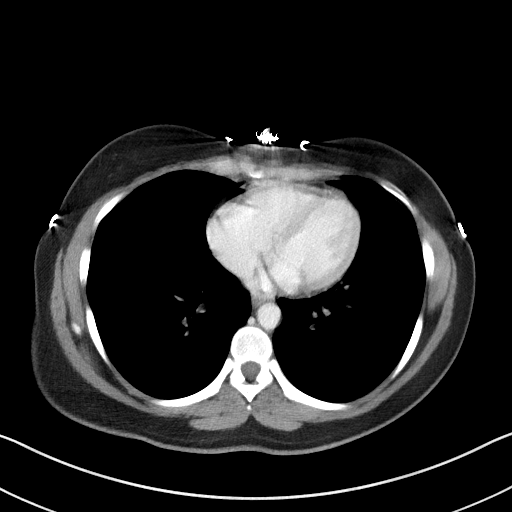

[Series 5: coronal st · coronal · 0.68mm/px · 3 of 81 slices shown]
[im 27/81  soft-tissue]
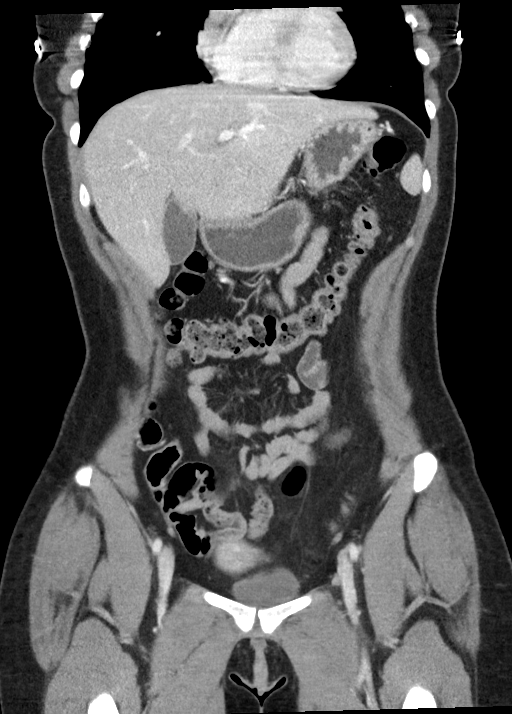
[im 36/81  soft-tissue]
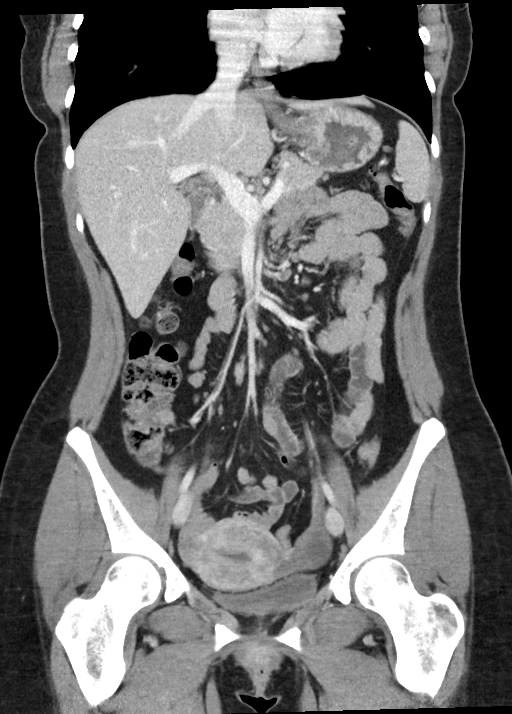
[im 45/81  soft-tissue]
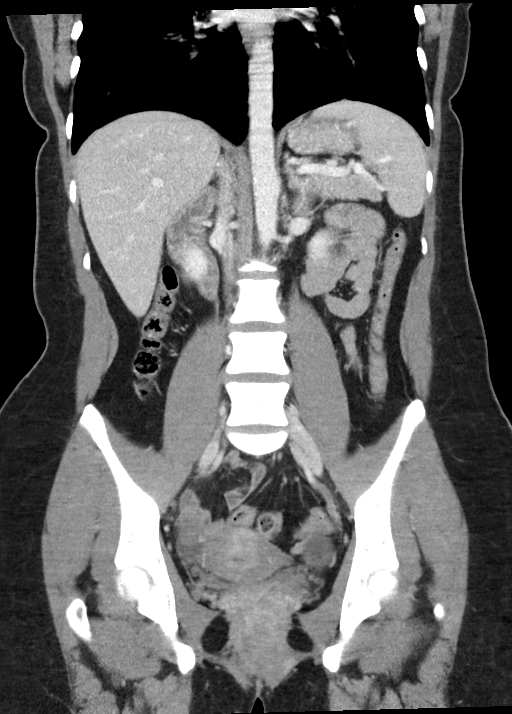

[16 of 46 positions shown; findings below may reference images not displayed]

FINDINGS: Lower chest: No acute abnormality.

Hepatobiliary: No solid liver abnormality is seen. No gallstones,
gallbladder wall thickening, or biliary dilatation.

Pancreas: Unremarkable. No pancreatic ductal dilatation or
surrounding inflammatory changes.

Spleen: Normal in size without significant abnormality.

Adrenals/Urinary Tract: Adrenal glands are unremarkable. Kidneys are
normal, without renal calculi, solid lesion, or hydronephrosis.
Bladder is unremarkable.

Stomach/Bowel: Stomach is within normal limits. Appendix appears
normal. No evidence of bowel wall thickening, distention, or
inflammatory changes.

Vascular/Lymphatic: No significant vascular findings are present. No
enlarged abdominal or pelvic lymph nodes.

Reproductive: No mass or other significant abnormality. There is a
3.1 cm cyst of the left ovary.

Other: No abdominal wall hernia or abnormality. No abdominopelvic
ascites.

Musculoskeletal: No acute or significant osseous findings.
IMPRESSION: 1. No acute CT findings of the abdomen to explain left lower
quadrant pain.

2. There is a 3.1 cm cyst of the left ovary, potentially
symptomatic. Consider pelvic ultrasound to further evaluate if pain
is referable.

## 2022-04-12 DIAGNOSIS — Z0000 Encounter for general adult medical examination without abnormal findings: Secondary | ICD-10-CM | POA: Diagnosis not present

## 2022-04-12 DIAGNOSIS — Z1389 Encounter for screening for other disorder: Secondary | ICD-10-CM | POA: Diagnosis not present

## 2022-05-07 ENCOUNTER — Emergency Department
Admission: EM | Admit: 2022-05-07 | Discharge: 2022-05-07 | Disposition: A | Discharge status: Home / Self Care | Payer: BC Managed Care – PPO | Attending: Emergency Medicine | Admitting: Emergency Medicine

## 2022-05-07 ENCOUNTER — Other Ambulatory Visit: Payer: Self-pay

## 2022-05-07 ENCOUNTER — Encounter: Payer: Self-pay | Admitting: Emergency Medicine

## 2022-05-07 DIAGNOSIS — M545 Low back pain, unspecified: Secondary | ICD-10-CM | POA: Diagnosis not present

## 2022-05-07 DIAGNOSIS — M62830 Muscle spasm of back: Secondary | ICD-10-CM | POA: Diagnosis not present

## 2022-05-07 DIAGNOSIS — M5441 Lumbago with sciatica, right side: Secondary | ICD-10-CM

## 2022-05-07 LAB — URINALYSIS, ROUTINE W REFLEX MICROSCOPIC
Bilirubin Urine: NEGATIVE
Glucose, UA: NEGATIVE mg/dL
Hgb urine dipstick: NEGATIVE
Ketones, ur: NEGATIVE mg/dL
Leukocytes,Ua: NEGATIVE
Nitrite: NEGATIVE
Protein, ur: NEGATIVE mg/dL
Specific Gravity, Urine: 1.005 (ref 1.005–1.030)
pH: 6 (ref 5.0–8.0)

## 2022-05-07 MED ORDER — METHOCARBAMOL 500 MG PO TABS
1000.0000 mg | ORAL_TABLET | Freq: Once | ORAL | Status: AC
  Administered 2022-05-07: 1000 mg via ORAL
  Filled 2022-05-07: qty 2

## 2022-05-07 MED ORDER — DIAZEPAM 2 MG PO TABS
2.0000 mg | ORAL_TABLET | Freq: Once | ORAL | Status: AC
  Administered 2022-05-07: 2 mg via ORAL
  Filled 2022-05-07: qty 1

## 2022-05-07 MED ORDER — TIZANIDINE HCL 4 MG PO TABS: 4.0000 mg | ORAL_TABLET | Freq: Three times a day (TID) | ORAL | 0 refills | Status: AC

## 2022-05-07 MED ORDER — KETOROLAC TROMETHAMINE 30 MG/ML IJ SOLN
30.0000 mg | Freq: Once | INTRAMUSCULAR | Status: AC
  Administered 2022-05-07: 30 mg via INTRAMUSCULAR
  Filled 2022-05-07: qty 1

## 2022-05-07 NOTE — ED Notes (Signed)
Patient is tearful, crying out. Patient had difficulty sitting up to take PO medication, but was able to maintain a half-seated position long enough to take the pills. Family at bedside. Patient states her son is going to come get her. Patient was  given a  warm blanket for comfort. Patient was given a urine collection bottle and instructions to hallway bathroom when able to walk.

## 2022-05-07 NOTE — Discharge Instructions (Signed)
You have been seen today in the emergency room for low back pain/spasms.  He will be discharged today with a short course of muscle relaxers to be taken 3 times a day for the next 3 days.  You have been instructed not to use any sort of heavy machinery or drive while taking this medication.  You have also been given a work note for the next 3 days.  Please follow-up with your primary care provider the first of next week if symptoms persist.

## 2022-05-07 NOTE — ED Notes (Signed)
Patient denies pregnancy and is in too much pain to ambulate to hallway restroom to give urine specimen. Ali Lowe NP aware.

## 2022-05-07 NOTE — ED Provider Notes (Signed)
[facility] Emergency Department Provider Note   ____________________________________________   Event Date/Time   First MD Initiated Contact with Patient 05/07/22 1111     (approximate)  I have reviewed the triage vital signs and the nursing notes.   HISTORY  Chief Complaint Back Pain    HPI Shelby Montgomery is a 36 y.o. female patient presents to the emergency room for complaint of lower back pain.  Patient reports that she has not fallen/lifted anything heavy/had any injuries to her back.  Patient reports that she was at work yesterday doing normal activities but did lift and move a heavy piece of equipment.  Patient reports that last night she started with significant amount of right-sided back pain that has now spread across to the left side as well.  Patient reports that approximately 1 year ago she had a similar issue with her back and was treated for "muscle spasms".  Patient reports that this pain feels the same. Patient reports that she is having pain with standing/bearing weight/walking. Patient denies any urinary symptoms.  Patient denies any change in bowel or bladder habits Patient reports that her pain is now a 10 out of 10.  She is not taking anything for the pain at this time.  Marland Kitchen   No past medical history on file.  There are no problems to display for this patient.   No past surgical history on file.  Prior to Admission medications   Medication Sig Start Date End Date Taking? Authorizing Provider  tiZANidine (ZANAFLEX) 4 MG tablet Take 1 tablet (4 mg total) by mouth 3 (three) times daily for 3 days. 05/07/22 05/10/22 Yes Herschell Dimes, NP  HYDROcodone-acetaminophen (NORCO/VICODIN) 5-325 MG tablet Take 1 tablet by mouth every 6 (six) hours as needed for moderate pain. 06/15/20   Tommi Rumps, PA-C  naproxen (NAPROSYN) 500 MG tablet Take 1 tablet (500 mg total) by mouth 2 (two) times daily with a meal. 06/15/20   Tommi Rumps,  PA-C    Allergies Patient has no known allergies.  No family history on file.  Social History Social History   Tobacco Use   Smoking status: Never   Smokeless tobacco: Never  Substance Use Topics   Alcohol use: No   Drug use: No    Review of Systems  Constitutional: No fever/chills Eyes: No visual changes. ENT: No sore throat. Cardiovascular: Denies chest pain. Respiratory: Denies shortness of breath. Gastrointestinal: No abdominal pain.  No nausea, no vomiting.  No diarrhea.  No constipation. Genitourinary: Negative for dysuria. Musculoskeletal: Positive for back pain Skin: Negative for rash. Neurological: Negative for headaches, focal weakness or numbness.   ____________________________________________   PHYSICAL EXAM:  VITAL SIGNS: ED Triage Vitals  Enc Vitals Group     BP 05/07/22 1044 (!) 132/98     Pulse Rate 05/07/22 1044 (!) 101     Resp 05/07/22 1044 18     Temp 05/07/22 1044 98.1 F (36.7 C)     Temp Source 05/07/22 1044 Oral     SpO2 05/07/22 1044 99 %     Weight 05/07/22 1045 145 lb 1 oz (65.8 kg)     Height 05/07/22 1045 5\' 1"  (1.549 m)     Head Circumference --      Peak Flow --      Pain Score 05/07/22 1045 10     Pain Loc --      Pain Edu? --      Excl.  in GC? --     Constitutional: Alert and oriented. Well appearing and in no acute distress. Eyes: Conjunctivae are normal. PERRL. EOMI. Head: Atraumatic. Nose: No congestion/rhinnorhea. Mouth/Throat: Mucous membranes are moist.  Oropharynx non-erythematous. Neck: No stridor.   Cardiovascular: Normal rate, regular rhythm. Grossly normal heart sounds.  Good peripheral circulation. Respiratory: Normal respiratory effort.  No retractions. Lungs CTAB. Gastrointestinal: Soft and nontender. No distention. No abdominal bruits. No CVA tenderness. Musculoskeletal: No lower extremity tenderness nor edema.  No joint effusions.  On exam patient has significant amount of pain with palpation to the  lumbar region of the spine over into the right side of back as well as the right hip.  No step-off noted.  There is noted muscle spasm at this time.  She has decreased range of motion.  She denies any numbness or tingling in lower extremities.  She has positive straight leg raise.  She has negative CVA tenderness. Neurologic:  Normal speech and language. No gross focal neurologic deficits are appreciated. No gait instability. Skin:  Skin is warm, dry and intact. No rash noted. Psychiatric: Mood and affect are normal. Speech and behavior are normal.  ____________________________________________   LABS (all labs ordered are listed, but only abnormal results are displayed)  Labs Reviewed  URINALYSIS, ROUTINE W REFLEX MICROSCOPIC - Abnormal; Notable for the following components:      Result Value   Color, Urine STRAW (*)    APPearance CLEAR (*)    All other components within normal limits   ____________________________________________  EKG   ____________________________________________  RADIOLOGY  ED MD interpretation:    Official radiology report(s): No results found.  ____________________________________________   PROCEDURES  Procedure(s) performed: None  Procedures  Critical Care performed: No  ____________________________________________   INITIAL IMPRESSION / ASSESSMENT AND PLAN / ED COURSE     Shelby Montgomery is a 36 y.o. female patient presents to the emergency room for complaint of lower back pain.  Patient reports that she has not fallen/lifted anything heavy/had any injuries to her back.  Patient reports that she was at work yesterday doing normal activities but did lift and move a heavy piece of equipment.  Patient reports that last night she started with significant amount of right-sided back pain that has now spread across to the left side as well.  Patient reports that approximately 1 year ago she had a similar issue with her back and was treated for  "muscle spasms".  Patient reports that this pain feels the same. Patient reports that she is having pain with standing/bearing weight/walking. Patient denies any urinary symptoms.  Patient denies any change in bowel or bladder habits.  Patient has no history of any back surgeries.  She has no symptoms that would be suggestive of cauda equina. Patient reports that she is not pregnant.  She understands the risk of taking Toradol and Robaxin if she is pregnant, as patient states she is unable to ambulate to the bathroom to give urine sample for pregnancy test.  Will give patient IM injection of Toradol as well as Robaxin at 1000 mg to see if relief is obtained. We will also obtain urinalysis.  On reevaluation patient reports no change in symptoms.  I discussed with patient possibility of imaging.  However she reports that she is not able to stand or lay for imaging.  I discussed with her that this would be the only way that imaging could be obtained. Will give patient Valium 2 mg.  Patient's urinalysis  is unremarkable.  Patient reports some relief after Valium.  She is able to stand and ambulate with minimal pain.  Patient reports that she still feels a "pulling sensation" in her back. Patient will be discharged home in stable condition at this time with prescription for tizanidine to be taken 3 times a day for the next 3 days and to follow-up with her primary care provider.  She will also be provided with a work note for the next 3 days. She is given strict return precautions for urgent care or the emergency room.     ____________________________________________   FINAL CLINICAL IMPRESSION(S) / ED DIAGNOSES  Final diagnoses:  Acute right-sided low back pain with right-sided sciatica  Muscle spasm of back     ED Discharge Orders          Ordered    tiZANidine (ZANAFLEX) 4 MG tablet  3 times daily        05/07/22 1358             Note:  This document was prepared using Dragon  voice recognition software and may include unintentional dictation errors.     Herschell Dimes, NP 05/07/22 1400    Georga Hacking, MD 05/07/22 Paulo Fruit

## 2022-05-07 NOTE — ED Notes (Signed)
Side rail was raised for safety. Patient's daughters are on other side. Patient was given instructions to use call bell if she needs to get up to  go to the restroom.

## 2022-05-07 NOTE — ED Triage Notes (Signed)
Pt here with back pain that started yesterday. Pt states she was at work when the pain began. Pt states she was not doing anything strenuous but the pain got worse over time. Pt states she was not able to walk this morning.

## 2022-05-17 ENCOUNTER — Encounter: Payer: Self-pay | Admitting: Obstetrics and Gynecology

## 2022-05-17 ENCOUNTER — Ambulatory Visit: Payer: BC Managed Care – PPO | Admitting: Obstetrics and Gynecology

## 2022-05-17 VITALS — BP 108/70 | Ht 61.0 in | Wt 132.0 lb

## 2022-05-17 DIAGNOSIS — R1032 Left lower quadrant pain: Secondary | ICD-10-CM | POA: Diagnosis not present

## 2022-05-17 DIAGNOSIS — N941 Unspecified dyspareunia: Secondary | ICD-10-CM

## 2022-05-17 NOTE — Patient Instructions (Signed)
I value your feedback and you entrusting us with your care. If you get a [facility] patient survey, I would appreciate you taking the time to let us know about your experience today. Thank you! ? ? ?

## 2022-05-17 NOTE — Progress Notes (Signed)
[facility] [place]   Chief Complaint  Patient presents with   Pelvic Pain    Left side only x more than one year    HPI:      Ms. Shelby Montgomery is a 36 y.o. No obstetric history on file. whose LMP was No LMP recorded. Patient has had an implant., presents today for LLQ pain/dyspareunia for over a yr, referred by PCP. Patient with sharp, intermittent pains vs constant ache, lasts for ~1 1/2 hrs; resolves then recurs Q1-3 wks. Sex causes sharp/stabbing pains every time for a yr but pain subsides after IC over. No other aggrav factors/triggers for LLQ pain; can be random, too. Heat decreases sx. No bleeding with sex, no new partners. Pt with constipation, infrequent Bms with "balls". Takes fiber supp occas. No urin sx, no vag sx. Pt exercises but this doesn't trigger sx. Hx of ovar cysts in past. Currently on nexplanon, no menses/dysmen. Menses were monthly prior to nexplanon, lasting 3 days, mild dysmen.   Hx of recent muscle spasm RT back, causing sciatica, treated in ED with muscle relaxer. Pt doesn't do low back/core exercises/stretching. Has hurt low back at work other times at work, too.   Neg pap with PCP 8/23.  PCP  notes reviewed.  Past Medical History:  Diagnosis Date   Muscle strain    Ovarian cyst     Past Surgical History:  Procedure Laterality Date   NO PAST SURGERIES      History reviewed. No pertinent family history.  Social History   Socioeconomic History   Marital status: Married    Spouse name: Not on file   Number of children: Not on file   Years of education: Not on file   Highest education level: Not on file  Occupational History   Not on file  Tobacco Use   Smoking status: Never   Smokeless tobacco: Never  Vaping Use   Vaping Use: Never used  Substance and Sexual Activity   Alcohol use: No   Drug use: No   Sexual activity: Yes    Birth control/protection: Implant  Other Topics Concern   Not on file  Social History  Narrative   Not on file   Social Determinants of Health   Financial Resource Strain: Not on file  Food Insecurity: Not on file  Transportation Needs: Not on file  Physical Activity: Not on file  Stress: Not on file  Social Connections: Not on file  Intimate Partner Violence: Not on file    Outpatient Medications Prior to Visit  Medication Sig Dispense Refill   etonogestrel (NEXPLANON) 68 MG IMPL implant 1 each by Subdermal route once. Insertion date 03/17/2020, left arm.     naproxen (NAPROSYN) 500 MG tablet Take 1 tablet (500 mg total) by mouth 2 (two) times daily with a meal. 20 tablet 0   No facility-administered medications prior to visit.      ROS:  Review of Systems  Constitutional:  Negative for fever.  Gastrointestinal:  Negative for blood in stool, constipation, diarrhea, nausea and vomiting.  Genitourinary:  Positive for dyspareunia and pelvic pain. Negative for dysuria, flank pain, frequency, hematuria, urgency, vaginal bleeding, vaginal discharge and vaginal pain.  Musculoskeletal:  Negative for back pain.  Skin:  Negative for rash.   BREAST: No symptoms   OBJECTIVE:   Vitals:  BP 108/70   Ht 5\' 1"  (1.549 m)   Wt 132 lb (59.9 kg)   BMI 24.94 kg/m  Physical Exam Vitals reviewed.  Constitutional:      Appearance: She is well-developed.  Pulmonary:     Effort: Pulmonary effort is normal.  Abdominal:     Palpations: Abdomen is soft.     Tenderness: There is abdominal tenderness in the suprapubic area and left lower quadrant. There is no guarding or rebound.  Genitourinary:    General: Normal vulva.     Pubic Area: No rash.      Labia:        Right: No rash, tenderness or lesion.        Left: No rash, tenderness or lesion.      Vagina: Normal. No vaginal discharge, erythema or tenderness.     Cervix: No cervical motion tenderness.     Uterus: Normal. Tender. Not enlarged.      Adnexa: Right adnexa normal.       Right: No mass or tenderness.          Left: Tenderness present. No mass.    Musculoskeletal:        General: Normal range of motion.     Cervical back: Normal range of motion.  Skin:    General: Skin is warm and dry.  Neurological:     General: No focal deficit present.     Mental Status: She is alert and oriented to person, place, and time.  Psychiatric:        Mood and Affect: Mood normal.        Behavior: Behavior normal.        Thought Content: Thought content normal.        Judgment: Judgment normal.     Assessment/Plan: LLQ pain - Plan: US PELVIS TRANSVAGINAL NON-OB (TV ONLY); for over a year. Tender on exam. Check Gyn u/s, will f/u with results. Question constipation vs MSK if u/s WNL. Pt to work on constipation in meantime with daily fiber/colace/lots of water. Start LB/pelvic stretching/exercises in case MSK.   Dyspareunia in female--with LLQ pain. Check GYN u/s.   Constipation--increase fiber, add colace, increase water.     Return if symptoms worsen or fail to improve.  [name], PA-C 05/17/2022 4:45 PM

## 2022-06-02 ENCOUNTER — Encounter: Payer: Self-pay | Admitting: Obstetrics

## 2022-06-02 ENCOUNTER — Ambulatory Visit (INDEPENDENT_AMBULATORY_CARE_PROVIDER_SITE_OTHER): Payer: BC Managed Care – PPO

## 2022-06-02 ENCOUNTER — Ambulatory Visit: Payer: Self-pay

## 2022-06-02 ENCOUNTER — Ambulatory Visit: Payer: BC Managed Care – PPO

## 2022-06-02 DIAGNOSIS — R1032 Left lower quadrant pain: Secondary | ICD-10-CM | POA: Diagnosis not present

## 2022-06-05 ENCOUNTER — Ambulatory Visit: Payer: Self-pay

## 2022-06-13 NOTE — Progress Notes (Signed)
Pls call pt to let her know the GYN u/s is normal for the pain she has had for over a year. There is a follicle on her LT ovary but that is normal with having periods and goes away in a few wks. Wouldn't be cause of her sx for so long. Has constipation tx helped with her dyspareunia/pain sx? If not, I can refer to pelvic PT to see if that would help. Thx

## 2022-06-14 ENCOUNTER — Telehealth: Payer: Self-pay | Admitting: Obstetrics and Gynecology

## 2022-06-14 DIAGNOSIS — R1032 Left lower quadrant pain: Secondary | ICD-10-CM

## 2022-06-14 DIAGNOSIS — N941 Unspecified dyspareunia: Secondary | ICD-10-CM

## 2022-06-14 NOTE — Telephone Encounter (Signed)
Refer to pelvic PT given 1 yr hx of dyspareunia; hx of low back strain several times. Question MSK etiology.

## 2022-06-28 ENCOUNTER — Ambulatory Visit: Payer: BC Managed Care – PPO | Attending: Obstetrics and Gynecology

## 2022-06-28 DIAGNOSIS — M6289 Other specified disorders of muscle: Secondary | ICD-10-CM

## 2022-06-28 DIAGNOSIS — N941 Unspecified dyspareunia: Secondary | ICD-10-CM | POA: Insufficient documentation

## 2022-06-28 DIAGNOSIS — M6281 Muscle weakness (generalized): Secondary | ICD-10-CM

## 2022-06-28 DIAGNOSIS — M79605 Pain in left leg: Secondary | ICD-10-CM | POA: Diagnosis not present

## 2022-06-28 DIAGNOSIS — R278 Other lack of coordination: Secondary | ICD-10-CM

## 2022-06-28 DIAGNOSIS — M545 Low back pain, unspecified: Secondary | ICD-10-CM | POA: Diagnosis not present

## 2022-06-28 NOTE — Therapy (Signed)
OUTPATIENT PHYSICAL THERAPY FEMALE PELVIC EVALUATION   Patient Name: Shelby Montgomery MRN: [id number] DOB:1985/09/12, 36 y.o., female Today's Date: 06/28/2022   PT End of Session - 06/28/22 1532     Visit Number 1    Number of Visits 10    Date for PT Re-Evaluation 09/06/22    Authorization Type IE: 06/28/22    PT Start Time 1530    PT Stop Time 1615    PT Time Calculation (min) 45 min    Activity Tolerance Patient tolerated treatment well             Past Medical History:  Diagnosis Date   Muscle strain    Ovarian cyst    Past Surgical History:  Procedure Laterality Date   CESAREAN SECTION  11/12/2013   NO PAST SURGERIES     There are no problems to display for this patient.   PCP: [facility]   REFERRING PROVIDER: Copland, Deirdre Evener, PA-C   REFERRING DIAG:  N94.10 (ICD-10-CM) - Dyspareunia in female   THERAPY DIAG:  Pelvic floor dysfunction  Other lack of coordination  Muscle weakness (generalized)  Low back pain radiating to left lower extremity  Rationale for Evaluation and Treatment: Rehabilitation  ONSET DATE: This past year it has gotten worse  RED FLAGS: N/A  Have you had any night sweats?  Unexplained weight loss? Saddle anesthesia?  Unexplained changes in bowel or bladder habits?   SUBJECTIVE: Patient confirms identification and approves PT to assess pelvic floor and treatment Yes                                                                                                                                                                                           PRECAUTIONS: None  WEIGHT BEARING RESTRICTIONS: No  FALLS:  Has patient fallen in last 6 months? 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 Pt has had various visits to the ER due to this pain. Pt was told she had a cyst but it has been little. Everytime she has came to the ER they tell her she has a cyst in the L ovary but then send her home. Pt when standing for a long period of time begins to have numbness/tingling down the LLE to the foot. This began last year and has progressively became worse.    PAIN:  Are you having pain?  Yes NPRS scale: 5/10 (current), 9/10 (worse) Pain location: External and Left  Pain type: burning and sharp Pain description: constant and stabbing   Aggravating factors: standing for long periods, bending/lifting heavy objects, intercourse  Relieving factors: stretching, walking, heating pad, OTCs with 9/10 but does not make the pain fully relieve    PATIENT GOALS: Pt would like to decrease pain especially in the LLQ, Pt would like to be able to return to ADLs and walking normally without pain in the L leg    UROLOGICAL HISTORY Fluid intake: Yes  Pain with urination: No Fully empty bladder: Yes  Frequency: 5-6x per day Nocturia: 1x   GASTROINTESTINAL HISTORY Pain with bowel movement: Yes Type of bowel movement:Type (Bristol Stool Scale) 1 (without Miralax) and Type 4 (with Miralax) and Strain Yes Frequency: 2x a week, everyday now on Miralax  Fully empty rectum: No Leakage: No    SEXUAL HISTORY/FUNCTION Pain with intercourse: During Penetration and Pain Interrupts Intercourse Ability to have vaginal penetration:  Yes; Deep thrusting: Yes Able to achieve orgasm?: Yes  OBSTETRICAL HISTORY Vaginal deliveries: G4P4 Tearing: one tearing  C-section deliveries: one Currently pregnant: No  GYNECOLOGICAL HISTORY Hysterectomy: no Pelvic Organ Prolapse: None Pain with exam: yes no Heaviness/pressure: yes no   OBJECTIVE:    COGNITION: Overall cognitive status: Within functional limits for tasks assessed     POSTURE:  Demonstrated Gower's sign when  standing and Valsalva maneuver as well, as leaving session antalgic gait noted   Lumbar lordosis:   Thoracic kyphosis: Iliac crest height:  Lumbar lateral shift:  Pelvic obliquity:  Leg length discrepancy:   GAIT: Deferred 2/2 time constraints  Distance walked:  Assistive device utilized:  Level of assistance:  Comments:   Trendelenburg:   SENSATION: Deferred 2/2 time constraints  Light touch: , L2-S2 dermatomes  Proprioception:    RANGE OF MOTION:  Deferred 2/2 time constraints   (Norm range in degrees)  LEFT  RIGHT   Lumbar forward flexion (65):      Lumbar extension (30):     Lumbar lateral flexion (25):     Thoracic and Lumbar rotation (30 degrees):       Hip Flexion (0-125):      Hip IR (0-45):     Hip ER (0-45):     Hip Adduction:      Hip Abduction (0-40):     Hip extension (0-15):     (*= pain, Blank rows = not tested)   STRENGTH: MMT  Deferred 2/2 time constraints   RLE  LLE   Hip Flexion    Hip Extension    Hip Abduction     Hip Adduction     Hip ER     Hip IR     Knee Extension    Knee Flexion    Dorsiflexion     Plantarflexion (seated)    (*= pain, Blank rows = not tested)   SPECIAL TESTS: Deferred 2/2 time constraints  Centralization and Peripheralization (SN 92, -LR 0.12):  Slump (SN 83, -LR 0.32):  SLR (SN 92, -LR 0.29): R: Lumbar quadrant (SN 70): R:  FABER (SN 81): FADIR (SN 94):  Hip scour (SN 50):  Thigh Thrust (SN 88, -LR 0.18) : Distraction (YQ65):  Compression (SN/SP 69): Stork/March (SP 93):   PHYSICAL PERFORMANCE MEASURES: Deferred 2/2 time constraints   STS:  RLE SLS:  LLE SLS:  6 MWT:  10MWT:  5TSTS:   PALPATION: Deferred 2/2 time constraints  Abdominal:  Diastasis:  finger above umbilicus,  fingers at and below umbilicus  Scar mobility: present/mobile perpendicular, parallel Rib flare: present/absent  EXTERNAL PELVIC EXAM: Patient educated on the purpose of the pelvic exam and articulated understanding;  patient consented to the exam verbally. Deferred 2/2 time constraints  Palpation: Breath coordination: present/absent/inconsistent Voluntary Contraction: present/absent Relaxation: full/delayed/non-relaxing Perineal movement with sustained IAP increase ("bear down"): descent/no change/elevation/excessive descent Perineal movement with rapid IAP increase ("cough"): elevation/no change/descent Pubic symphysis: (0= no contraction, 1= flicker, 2= weak squeeze, 3= fair squeeze with lift, 4= good squeeze and lift against resistance, 5= strong squeeze against strong resistance)   INTERNAL PELVIC EXAM: Patient educated on the purpose of the pelvic exam and articulated understanding; patient consented to the exam verbally. Deferred 2/2 to time constraints Introitus Appears:  Skin integrity:  Scar mobility: Strength (PERF):  Symmetry: Palpation: Prolapse: (0= no contraction, 1= flicker, 2= weak squeeze, 3= fair squeeze with lift, 4= good squeeze and lift against resistance, 5= strong squeeze against strong resistance)    TODAY'S TREATMENT: EVAL + Treat   Neuromuscular Re-education: Discussion on proper toileting mechanics and posture with use of diaphragmatic breathing to aid in decreasing straining and allow BM to be expelled without increased spasm. Pt verbalized understanding.   Discussion on using breathing mechanics to aid in decreasing pain especially when at worst pain. Diaphragmatic breathing practiced in sitting position.   Sitting piriformis pose with diaphragmatic breathing for improved tissue extensibility and pain modulation. VCs and TCs required.     Patient Education:  Patient educated on what to expect during course of physical therapy, POC, and provided with HEP including: toileting posture, supine diaphragmatic breathing, sitting piriformis pose. Patient verbalized understanding and returned demonstration. Patient will benefit from further education in order to maximize  compliance and understanding for long-term therapeutic gains.   Patient Surveys:  [facility] - will assess next session     ASSESSMENT:  Clinical Impression: Patient is a 36 y.o. who was seen today for physical therapy evaluation and treatment for a chief concern of pelvic pain. Today's evaluation suggest deficits in IAP management, PFM coordination, PFM extensibility, posture, sensation, pain, and scar mobility as evidenced by worst LLQ pain, 9/10 (NPRS), described as sharp, current constant LLQ 5/10, N/T down the LLE in sitting and after prolonged periods of sitting, increased LLQ pain with lifting/bending/sitting for long periods/intercourse, occasional pain with BMs and having to strain, hx of a c-section and perineal tearing, and observed Gower's sign and antalgic gait due to increased gluteal pain. Patient's responses on FOTO will be assessed next session. Pt given moderate VCs and TCs for pain modulation techniques and seated diaphragmatic breathing. Patient's progress may be limited due to time since onset; however, patient's motivation is advantageous. Pt with basic understanding of PFM function in bowel/bladder habits, posture, deep core, and pain. Patient will benefit from skilled therapeutic intervention to address deficits in IAP management, PFM coordination, PFM extensibility, posture, sensation, pain, and scar mobility in order to increase PLOF and improve overall QOL.    Objective Impairments: decreased activity tolerance, decreased coordination, difficulty walking, decreased strength, increased fascial restrictions, impaired sensation, improper body mechanics, and pain.   Activity Limitations: carrying, lifting, bending, sitting, sleeping, toileting, and locomotion level  Personal Factors: Age, Behavior pattern, Past/current experiences, and Time since onset of injury/illness/exacerbation are also affecting patient's functional outcome.   Rehab Potential: Good  Clinical Decision  Making: Evolving/moderate complexity  Evaluation Complexity: Moderate   GOALS: Goals reviewed with patient? Yes  SHORT TERM GOALS:  Target date: 08/02/2022  Patient will demonstrate independence with HEP in order to maximize therapeutic gains and improve carryover from physical therapy sessions to ADLs in the home and community. Baseline: toileting posture and breathing Goal status: INITIAL    LONG TERM GOALS: Target date: 09/06/2022   Patient will demonstrate coordinated lengthening and relaxation of PFM with diaphragmatic inhalation in order to decrease spasm and allow for unrestricted elimination of urine/feces for improved overall QOL. Baseline: Pt with hx of straining for BMs and using Miralax (Type 1 Bristol Stool Chart) Goal status: INITIAL  2.  Patient will decrease worst pain as reported on NPRS by at least 2 points to demonstrate clinically significant reduction in pain in order to restore/improve function and overall QOL. Baseline: 9/10 (LLQ)  Goal status: INITIAL  3.  Patient will report being able to return to activities including, but not limited to: sitting for long periods, lifting, bending, intercourse, increased physical activity without pain or limitation to indicate complete resolution of the chief concern and return to prior level of participation at home and in the community. Baseline: Pain in LLQ with all the above and Pt limits herself on what she can do because of pain  Goal status: INITIAL  4.  Patient will improve FOTO Pain/Lumbar Spine score by at least 8 points in order to demonstrate decreased pain, improved PFM coordination, improved tissue extensibility and overall QOL.  Baseline: will assess next session  Goal status: INITIAL  5.  Patient will be able too scar mobs Baseline:  Goal status: {GOALSTATUS:25110}  6.  *** Baseline:  Goal status: {GOALSTATUS:25110}  PLAN: PT Frequency: 1x/week  PT Duration: 10 weeks  Planned Interventions:  Therapeutic exercises, Therapeutic activity, Neuromuscular re-education, Balance training, Gait training, Patient/Family education, Self Care, Joint mobilization, Spinal mobilization, Cryotherapy, Moist heat, scar mobilization, Taping, and Manual therapy  Plan For Next Session: phy assess, how was toileting and the stretch?    Abhishek Levesque, PT, DPT  06/28/2022, 5:43 PM

## 2022-07-07 ENCOUNTER — Ambulatory Visit: Payer: BC Managed Care – PPO

## 2022-07-13 ENCOUNTER — Ambulatory Visit: Payer: BC Managed Care – PPO | Attending: Obstetrics and Gynecology

## 2022-07-13 ENCOUNTER — Telehealth: Payer: Self-pay

## 2022-07-13 DIAGNOSIS — M6289 Other specified disorders of muscle: Secondary | ICD-10-CM | POA: Insufficient documentation

## 2022-07-13 DIAGNOSIS — R278 Other lack of coordination: Secondary | ICD-10-CM | POA: Insufficient documentation

## 2022-07-13 DIAGNOSIS — M79605 Pain in left leg: Secondary | ICD-10-CM | POA: Insufficient documentation

## 2022-07-13 DIAGNOSIS — M545 Low back pain, unspecified: Secondary | ICD-10-CM | POA: Insufficient documentation

## 2022-07-13 DIAGNOSIS — M6281 Muscle weakness (generalized): Secondary | ICD-10-CM | POA: Insufficient documentation

## 2022-07-13 NOTE — Telephone Encounter (Signed)
Talked to Pt and she forgot about her appointment. Pt stated she will call the front office to see if anything is available before her next appt on 11/28.

## 2022-07-26 ENCOUNTER — Ambulatory Visit: Payer: BC Managed Care – PPO

## 2022-07-26 DIAGNOSIS — M79605 Pain in left leg: Secondary | ICD-10-CM | POA: Diagnosis not present

## 2022-07-26 DIAGNOSIS — M6281 Muscle weakness (generalized): Secondary | ICD-10-CM

## 2022-07-26 DIAGNOSIS — M545 Low back pain, unspecified: Secondary | ICD-10-CM | POA: Diagnosis not present

## 2022-07-26 DIAGNOSIS — R278 Other lack of coordination: Secondary | ICD-10-CM | POA: Diagnosis not present

## 2022-07-26 DIAGNOSIS — M6289 Other specified disorders of muscle: Secondary | ICD-10-CM | POA: Diagnosis not present

## 2022-07-26 NOTE — Therapy (Signed)
OUTPATIENT PHYSICAL THERAPY FEMALE PELVIC TREATMENT    Patient Name: Shelby Montgomery MRN: [id number] DOB:05-10-1986, 36 y.o., female Today's Date: 07/26/2022   PT End of Session - 07/26/22 1614     Visit Number 2    Number of Visits 10    Date for PT Re-Evaluation 09/06/22    Authorization Type IE: 06/28/22    PT Start Time 1615    PT Stop Time 1655    PT Time Calculation (min) 40 min    Activity Tolerance Patient tolerated treatment well             Past Medical History:  Diagnosis Date   Muscle strain    Ovarian cyst    Past Surgical History:  Procedure Laterality Date   CESAREAN SECTION  11/12/2013   NO PAST SURGERIES     There are no problems to display for this patient.   PCP: [facility]   REFERRING PROVIDER: Copland, Ilona Sorrel, PA-C   REFERRING DIAG:  N94.10 (ICD-10-CM) - Dyspareunia in female   THERAPY DIAG:  Pelvic floor dysfunction  Other lack of coordination  Muscle weakness (generalized)  Low back pain radiating to left lower extremity  Rationale for Evaluation and Treatment: Rehabilitation                                                                                                                                                                                           PRECAUTIONS: None  WEIGHT BEARING RESTRICTIONS: No  FALLS:  Has patient fallen in last 6 months? 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 Pt has had various visits to the ER due to this pain. Pt was told she had a cyst but it has been little. Everytime she has came to the ER they tell her she has a cyst in the L ovary but then send her home. Pt when standing for a long period of time begins to have numbness/tingling down the LLE to the foot. This began last year and has progressively became  worse.    Pain location: External and Left  Pain type: burning and sharp Pain description: constant and stabbing   Aggravating factors: standing for long periods, bending/lifting heavy objects, intercourse  Relieving factors: stretching, walking, heating pad, OTCs with 9/10 but does not make the pain fully relieve    PATIENT GOALS: Pt would like to decrease pain especially in the LLQ, Pt would like to be able to return to ADLs  and walking normally without pain in the L leg    UROLOGICAL HISTORY Fluid intake: Yes  Pain with urination: No Fully empty bladder: Yes  Frequency: 5-6x per day Nocturia: 1x   GASTROINTESTINAL HISTORY Pain with bowel movement: Yes Type of bowel movement:Type (Bristol Stool Scale) 1 (without Miralax) and Type 4 (with Miralax) and Strain Yes Frequency: 2x a week, everyday now on Miralax  Fully empty rectum: No Leakage: No    SEXUAL HISTORY/FUNCTION Pain with intercourse: During Penetration and Pain Interrupts Intercourse Ability to have vaginal penetration:  Yes; Deep thrusting: Yes Able to achieve orgasm?: Yes  OBSTETRICAL HISTORY Vaginal deliveries: G4P4 Tearing: one tearing  C-section deliveries: one Currently pregnant: No  GYNECOLOGICAL HISTORY Hysterectomy: no Pelvic Organ Prolapse: None Pain with exam: yes no Heaviness/pressure: yes no   SUBJECTIVE: Patient confirms identification and approves PT to assess pelvic floor and treatment Yes   PAIN:  Are you having pain? Yes NPRS scale: 8/10 LLE with tingling     TODAY'S TREATMENT  Neuromuscular Re-education   Pre-treatment assessment:   OBJECTIVE:    COGNITION: Overall cognitive status: Within functional limits for tasks assessed     POSTURE:   Iliac crest height: L iliac crest higher  Pelvic obliquity: L innominate posteriorly rotated    SENSATION:  Light touch: impaired, L5-S2 dermatomes    RANGE OF MOTION:    (Norm range in degrees)  LEFT 07/26/22  RIGHT 07/26/22  Lumbar forward flexion (65):  Restricted*    Lumbar extension (30): Restricted*    Lumbar lateral flexion (25):  WNL Restricted*  Thoracic and Lumbar rotation (30 degrees):       Hip Flexion (0-125):      Hip IR (0-45):     Hip ER (0-45):     Hip Adduction:      Hip Abduction (0-40):     Hip extension (0-15):     (*= pain, Blank rows = not tested)   STRENGTH: MMT    RLE 07/26/22 LLE 07/26/22  Hip Flexion 4 3  Hip Extension    Hip Abduction     Hip Adduction     Hip ER     Hip IR     Knee Extension 5 3  Knee Flexion 5 3  Dorsiflexion     Plantarflexion (seated) 5 3  (*= pain, Blank rows = not tested)   SPECIAL TESTS:  Slump (SN 83, -LR 0.32): + L SLR (SN 92, -LR 0.29): unable to test due to pain FABER (SN 81): unable to test due to pain FADIR (SN 94): unable to test due to pain   PALPATION: Deferred 2/2 time constraints  Abdominal:  Diastasis:  finger above umbilicus,  fingers at and below umbilicus  Scar mobility: present/mobile perpendicular, parallel Rib flare: present/absent  EXTERNAL PELVIC EXAM: Patient educated on the purpose of the pelvic exam and articulated understanding; patient consented to the exam verbally. Deferred 2/2 time constraints  Palpation: Breath coordination: present/absent/inconsistent Voluntary Contraction: present/absent Relaxation: full/delayed/non-relaxing Perineal movement with sustained IAP increase ("bear down"): descent/no change/elevation/excessive descent Perineal movement with rapid IAP increase ("cough"): elevation/no change/descent Pubic symphysis: (0= no contraction, 1= flicker, 2= weak squeeze, 3= fair squeeze with lift, 4= good squeeze and lift against resistance, 5= strong squeeze against strong resistance)   Manual Therapy: STM at L lumbar paraspinals and quadratus lumborum for pain modulation   STM at L gluteal region/SIJ ligamentous structures/superior attachment of hamstring for pain modulation   Significant tension noted at gluteals/piriformis with  N/T N/T eased with continued intervention but continued to have a dull ache   Neuromuscular Re-education: Discussion and demonstration on log roll technique for improved IAP management and to decrease lower back pain, or worsening of pain.  Use of diaphragmatic breathing while performing transitional movements in bed or from supine<>seated. Pt verbalized understanding.   Supine hooklying diaphragmatic breathing with VCs and TCs for downregulation of the nervous system and improved management of IAP - while on heat modality  Supine lateral trunk rotation with cueing for breathing for pain modulation, VCs and TCs required - while on heat modality  Right sidelying thoracic rotations, x10, for improved lengthening of the anterior fascial slings    Patient response to interventions: Pt ended session with 4/10 dull ache and some N/T in the L foot    Patient Education:  Patient provided with HEP including: log rolling technique, supine LTR, sidelying thoracic rotation. Patient educated throughout session on appropriate technique and form using multi-modal cueing, HEP, and activity modification. Patient will benefit from further education in order to maximize compliance and understanding for long-term therapeutic gains.   Patient Surveys:  Janyth Contes - will assess next session     ASSESSMENT:  Clinical Impression: Patient presents to clinic with excellent motivation to participate in today's session. Pt's objective assessment significantly impacted by pain with N/T at the LLE. Upon physical assessment, Pt demonstrates deficits in posture, sensation, ROM, pain, LE strength, and IAP management as evidenced by increased L iliac crest height, L posteriorly rotated innominate, restricted ROM in lumbar flexion/extension/R lateral flexion and ROM in seated of the LLE during MMT (3/5 throughout), impaired sensation at L5-S2 dermatomes, +Slump test on  L, and significant pain and N/T upon palpation of L paraspinals/quadratus lumborum/gluteal structures/upper hamstrings/piriformis. With continued manual intervention in sidelying, Pt's N/T decreased and continued to have a dull ache. Will continue objective assessment as able in future sessions. Pt required moderate VCs and TCs for pain modulation techniques to guide movement and proper technique. Pt ended session with 4/10 dull ache with N/T only at the L foot. Patient will continue to benefit from skilled therapeutic intervention to address deficits in IAP management, PFM coordination, PFM extensibility, posture, sensation, pain, ROM, LE strength, and scar mobility in order to increase PLOF and improve overall QOL.    Objective Impairments: decreased activity tolerance, decreased coordination, difficulty walking, decreased strength, increased fascial restrictions, impaired sensation, improper body mechanics, and pain.   Activity Limitations: carrying, lifting, bending, sitting, sleeping, toileting, and locomotion level  Personal Factors: Age, Behavior pattern, Past/current experiences, and Time since onset of injury/illness/exacerbation are also affecting patient's functional outcome.   Rehab Potential: Good  Clinical Decision Making: Evolving/moderate complexity  Evaluation Complexity: Moderate   GOALS: Goals reviewed with patient? Yes  SHORT TERM GOALS: Target date: 08/02/2022  Patient will demonstrate independence with HEP in order to maximize therapeutic gains and improve carryover from physical therapy sessions to ADLs in the home and community. Baseline: toileting posture and breathing Goal status: INITIAL    LONG TERM GOALS: Target date: 09/06/2022   Patient will demonstrate coordinated lengthening and relaxation of PFM with diaphragmatic inhalation in order to decrease spasm and allow for unrestricted elimination of urine/feces for improved overall QOL. Baseline: Pt with hx of  straining for BMs and using Miralax (Type 1 Bristol Stool Chart) Goal status: INITIAL  2.  Patient will decrease worst pain as reported on NPRS by at least 2 points to demonstrate clinically significant reduction in pain in order  to restore/improve function and overall QOL. Baseline: 9/10 (LLQ)  Goal status: INITIAL  3.  Patient will report being able to return to activities including, but not limited to: sitting for long periods, lifting, bending, intercourse, increased physical activity without pain or limitation to indicate complete resolution of the chief concern and return to prior level of participation at home and in the community. Baseline: Pain in LLQ with all the above and Pt limits herself on what she can do because of pain  Goal status: INITIAL  4.  Patient will improve FOTO Pain/Lumbar Spine score by at least 8 points in order to demonstrate decreased pain, improved PFM coordination, improved tissue extensibility and overall QOL.  Baseline: will assess next session  Goal status: INITIAL  5.  Patient will be able to demonstrate and verbalize scar mobilization techniques and myofascial abdominal release techniques paired with diaphragmatic breathing in order to aid in decreasing pain and downregulating the nervous system.  Baseline: will educate in future sessions Goal status: INITIAL   PLAN: PT Frequency: 1x/week  PT Duration: 10 weeks  Planned Interventions: Therapeutic exercises, Therapeutic activity, Neuromuscular re-education, Balance training, Gait training, Patient/Family education, Self Care, Joint mobilization, Spinal mobilization, Cryotherapy, Moist heat, scar mobilization, Taping, and Manual therapy  Plan For Next Session: did you try toileting, abdominal/external PFM, abdominal massage, more manual, FOTO   Quintin Hjort, PT, DPT  07/26/2022, 4:15 PM

## 2022-08-03 ENCOUNTER — Ambulatory Visit: Payer: BC Managed Care – PPO | Attending: Obstetrics and Gynecology

## 2022-08-03 DIAGNOSIS — M6281 Muscle weakness (generalized): Secondary | ICD-10-CM | POA: Diagnosis not present

## 2022-08-03 DIAGNOSIS — M6289 Other specified disorders of muscle: Secondary | ICD-10-CM | POA: Diagnosis not present

## 2022-08-03 DIAGNOSIS — M545 Low back pain, unspecified: Secondary | ICD-10-CM

## 2022-08-03 DIAGNOSIS — M79605 Pain in left leg: Secondary | ICD-10-CM | POA: Insufficient documentation

## 2022-08-03 DIAGNOSIS — R278 Other lack of coordination: Secondary | ICD-10-CM | POA: Diagnosis not present

## 2022-08-03 NOTE — Therapy (Signed)
OUTPATIENT PHYSICAL THERAPY FEMALE PELVIC TREATMENT    Patient Name: Taylor Spilde MRN: [id number] DOB:10/10/1985, 36 y.o., female Today's Date: 08/03/2022   PT End of Session - 08/03/22 1704     Visit Number 3    Number of Visits 10    Date for PT Re-Evaluation 09/06/22    Authorization Type IE: 06/28/22    PT Start Time 1701    PT Stop Time 1745    PT Time Calculation (min) 44 min    Activity Tolerance Patient tolerated treatment well             Past Medical History:  Diagnosis Date   Muscle strain    Ovarian cyst    Past Surgical History:  Procedure Laterality Date   CESAREAN SECTION  11/12/2013   NO PAST SURGERIES     There are no problems to display for this patient.   PCP: [facility]   REFERRING PROVIDER: Copland, Ilona Sorrel, PA-C   REFERRING DIAG:  N94.10 (ICD-10-CM) - Dyspareunia in female   THERAPY DIAG:  Pelvic floor dysfunction  Other lack of coordination  Muscle weakness (generalized)  Low back pain radiating to left lower extremity  Rationale for Evaluation and Treatment: Rehabilitation                                                                                                                                                                                           PRECAUTIONS: None  WEIGHT BEARING RESTRICTIONS: No  FALLS:  Has patient fallen in last 6 months? 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 Pt has had various visits to the ER due to this pain. Pt was told she had a cyst but it has been little. Everytime she has came to the ER they tell her she has a cyst in the L ovary but then send her home. Pt when standing for a long period of time begins to have numbness/tingling down the LLE to the foot. This began last year and has progressively became worse.     Pain location: External and Left  Pain type: burning and sharp Pain description: constant and stabbing   Aggravating factors: standing for long periods, bending/lifting heavy objects, intercourse  Relieving factors: stretching, walking, heating pad, OTCs with 9/10 but does not make the pain fully relieve    PATIENT GOALS: Pt would like to decrease pain especially in the LLQ, Pt would like to be able to return to ADLs  and walking normally without pain in the L leg    UROLOGICAL HISTORY Fluid intake: Yes  Pain with urination: No Fully empty bladder: Yes  Frequency: 5-6x per day Nocturia: 1x   GASTROINTESTINAL HISTORY Pain with bowel movement: Yes Type of bowel movement:Type (Bristol Stool Scale) 1 (without Miralax) and Type 4 (with Miralax) and Strain Yes Frequency: 2x a week, everyday now on Miralax  Fully empty rectum: No Leakage: No    SEXUAL HISTORY/FUNCTION Pain with intercourse: During Penetration and Pain Interrupts Intercourse Ability to have vaginal penetration:  Yes; Deep thrusting: Yes Able to achieve orgasm?: Yes  OBSTETRICAL HISTORY Vaginal deliveries: G4P4 Tearing: one tearing  C-section deliveries: one Currently pregnant: No  GYNECOLOGICAL HISTORY Hysterectomy: no Pelvic Organ Prolapse: None Pain with exam: yes no Heaviness/pressure: yes no   SUBJECTIVE:  Patient continues to practice HEP at home with heat and it does help diminish pain in the L leg but pain is still present.   PAIN:  Are you having pain? Yes NPRS scale: 8/10 LLE with tingling/numbness   OBJECTIVE:    COGNITION: Overall cognitive status: Within functional limits for tasks assessed     POSTURE:   Iliac crest height: L iliac crest higher  Pelvic obliquity: L innominate posteriorly rotated    SENSATION:  Light touch: impaired, L5-S2 dermatomes    RANGE OF MOTION:    (Norm range in degrees)  LEFT 07/26/22 RIGHT 07/26/22  Lumbar forward flexion (65):   Restricted*    Lumbar extension (30): Restricted*    Lumbar lateral flexion (25):  WNL Restricted*  Thoracic and Lumbar rotation (30 degrees):       Hip Flexion (0-125):      Hip IR (0-45):     Hip ER (0-45):     Hip Adduction:      Hip Abduction (0-40):     Hip extension (0-15):     (*= pain, Blank rows = not tested)   STRENGTH: MMT    RLE 07/26/22 LLE 07/26/22  Hip Flexion 4 3  Hip Extension    Hip Abduction     Hip Adduction     Hip ER     Hip IR     Knee Extension 5 3  Knee Flexion 5 3  Dorsiflexion     Plantarflexion (seated) 5 3  (*= pain, Blank rows = not tested)   SPECIAL TESTS:  Slump (SN 83, -LR 0.32): + L SLR (SN 92, -LR 0.29): unable to test due to pain FABER (SN 81): unable to test due to pain FADIR (SN 94): unable to test due to pain   PALPATION: Deferred 2/2 time constraints  Abdominal:  Diastasis:  finger above umbilicus,  fingers at and below umbilicus  Scar mobility: present/mobile perpendicular, parallel Rib flare: present/absent  EXTERNAL PELVIC EXAM: Patient educated on the purpose of the pelvic exam and articulated understanding; patient consented to the exam verbally. Deferred 2/2 time constraints  Palpation: Breath coordination: present/absent/inconsistent Voluntary Contraction: present/absent Relaxation: full/delayed/non-relaxing Perineal movement with sustained IAP increase ("bear down"): descent/no change/elevation/excessive descent Perineal movement with rapid IAP increase ("cough"): elevation/no change/descent Pubic symphysis: (0= no contraction, 1= flicker, 2= weak squeeze, 3= fair squeeze with lift, 4= good squeeze and lift against resistance, 5= strong squeeze against strong resistance)   TODAY'S TREATMENT  Manual Therapy: Prone:  STM at L lumbar paraspinals and quadratus lumborum for pain modulation   STM at L gluteal region/SIJ ligamentous structures/superior attachment of hamstring for pain modulation with movement (hip IR)  Significant tension noted at gluteals/piriformis with N/T  N/T eased with continued intervention but continued to have a dull ache   Neuromuscular Re-education: In prone Pre-treatment assessment: -PPVIMs from T8 to L5 -No hypomobility noted but increased pain at L5   Pain modulation techniques while on heat modality: Seated cat/cow with coordinated breath  Seated sciatic nerve glides, significant cueing for proper technique   Brief gait mechanics discussion during x100 ft Moderate cueing for increased gait speed as Pt described "pain" as "stretching/pulling" but not sharp and stabbing. Pt with increased fear during gait due to unexpected numbness in the LLE  Discussion on postural stance as Pt observed to have B hyperextension causing B gluteal activation. Pt able to demonstrate slight bend in B knees without increased discomfort at gluteals.    Patient response to interventions: Pt ended session with 4/10 dull ache and minimal antalgic gait   Patient Education:  Patient provided with HEP including: continued seated sciatic nerve glides and seated cat/cow. Patient educated throughout session on appropriate technique and form using multi-modal cueing, HEP, and activity modification. Patient will benefit from further education in order to maximize compliance and understanding for long-term therapeutic gains.   Patient Surveys:  Janyth Contes - will assess next session     ASSESSMENT:  Clinical Impression: Patient presents to clinic with excellent motivation to participate in today's session. Pt continues to demonstrate deficits in posture, sensation, ROM, pain, LE strength, and IAP management. Pt with noticeable antalgic gait at beginning of session due to increased pain at the L gluteals with radiation(N/T) towards the LLE, 8/10. Upon palpation of spinal column in prone, Pt with no hypomobility but with increased pain at L5. Unable to tolerate further Grade II CPAs. Pt with significant  muscular tension at L paraspinals and continued upper gluteals/piriformis/SIJ ligamentous structures with some N/T down LLE upon palpation. Pt required moderate VCs and TCs for pain modulation techniques to guide movement and to decrease bodily compensations (upper trap elevation and reduced movement of pelvis). During gait, DPT discussed increasing gait speed to allow for reciprocal UE/LE movement as Pt had more a "stretching" feeling than sharp pain. Pt verbalized understanding but demonstrates significant fear of movement. Pt ended session with 4/10 dull ache with N/T only at the L foot. Patient will continue to benefit from skilled therapeutic intervention to address deficits in IAP management, PFM coordination, PFM extensibility, posture, sensation, pain, ROM, LE strength, and scar mobility in order to increase PLOF and improve overall QOL.    Objective Impairments: decreased activity tolerance, decreased coordination, difficulty walking, decreased strength, increased fascial restrictions, impaired sensation, improper body mechanics, and pain.   Activity Limitations: carrying, lifting, bending, sitting, sleeping, toileting, and locomotion level  Personal Factors: Age, Behavior pattern, Past/current experiences, and Time since onset of injury/illness/exacerbation are also affecting patient's functional outcome.   Rehab Potential: Good  Clinical Decision Making: Evolving/moderate complexity  Evaluation Complexity: Moderate   GOALS: Goals reviewed with patient? Yes  SHORT TERM GOALS: Target date: 08/02/2022  Patient will demonstrate independence with HEP in order to maximize therapeutic gains and improve carryover from physical therapy sessions to ADLs in the home and community. Baseline: toileting posture and breathing Goal status: INITIAL    LONG TERM GOALS: Target date: 09/06/2022   Patient will demonstrate coordinated lengthening and relaxation of PFM with diaphragmatic inhalation in  order to decrease spasm and allow for unrestricted elimination of urine/feces for improved overall QOL. Baseline: Pt with hx of straining for BMs and using Miralax (  Type 1 Bristol Stool Chart) Goal status: INITIAL  2.  Patient will decrease worst pain as reported on NPRS by at least 2 points to demonstrate clinically significant reduction in pain in order to restore/improve function and overall QOL. Baseline: 9/10 (LLQ)  Goal status: INITIAL  3.  Patient will report being able to return to activities including, but not limited to: sitting for long periods, lifting, bending, intercourse, increased physical activity without pain or limitation to indicate complete resolution of the chief concern and return to prior level of participation at home and in the community. Baseline: Pain in LLQ with all the above and Pt limits herself on what she can do because of pain  Goal status: INITIAL  4.  Patient will improve FOTO Pain/Lumbar Spine score by at least 8 points in order to demonstrate decreased pain, improved PFM coordination, improved tissue extensibility and overall QOL.  Baseline: will assess next session  Goal status: INITIAL  5.  Patient will be able to demonstrate and verbalize scar mobilization techniques and myofascial abdominal release techniques paired with diaphragmatic breathing in order to aid in decreasing pain and downregulating the nervous system.  Baseline: will educate in future sessions Goal status: INITIAL   PLAN: PT Frequency: 1x/week  PT Duration: 10 weeks  Planned Interventions: Therapeutic exercises, Therapeutic activity, Neuromuscular re-education, Balance training, Gait training, Patient/Family education, Self Care, Joint mobilization, Spinal mobilization, Cryotherapy, Moist heat, scar mobilization, Taping, and Manual therapy  Plan For Next Session:  check hips, did you try toileting, abdominal/external PFM, abdominal massage, more manual, FOTO   Crickett Abbett,  PT, DPT  08/03/2022, 6:44 PM

## 2022-08-09 ENCOUNTER — Ambulatory Visit: Payer: BC Managed Care – PPO

## 2022-08-16 ENCOUNTER — Telehealth: Payer: Self-pay

## 2022-08-16 ENCOUNTER — Ambulatory Visit: Payer: BC Managed Care – PPO

## 2022-08-16 NOTE — Telephone Encounter (Signed)
LVM in regards to no-show to appointment today. DPT stated about calling rehab services back in order to figure out if Pt would like to continue with PFPT as DPT is leaving [facility] next week.

## 2022-08-18 NOTE — Therapy (Signed)
[place] [facility] SERVICES [address] [place], [place], [address] Phone: [phone]   Fax:  [phone]  August 18, 2022   No Recipients  Physical Therapy Discharge Summary  Patient: Shelby Montgomery  MRN: [id number]  Date of Birth: 1986-06-21   Diagnosis:  REFERRING PROVIDER: Chad Cordial, PA-C   REFERRING DIAG:  N94.10 (ICD-10-CM) - Dyspareunia in female   THERAPY DIAG:  Pelvic floor dysfunction  Other lack of coordination  Muscle weakness (generalized)  Low back pain radiating to left lower extremity  The above patient had been seen in Physical Therapy 3 times of 10 treatments scheduled with 2 no shows and 2 cancellations.  The treatment consisted of decreasing pain, improving PFM coordination/PFM extensibility, and downregulation of nervous system  The patient is: Unchanged  Subjective:  IE: 06/28/22 - Pt has been having LLQ and has noticed this after her c-section birth 9 years ago. Pt has had various visits to the ER due to this pain. Pt was told she had a cyst but it has been little. Everytime she has came to the ER they tell her she has a cyst in the L ovary but then send her home. Pt when standing for a long period of time begins to have numbness/tingling down the LLE to the foot. This began last year and has progressively became worse.   Last session: 08/03/22 - Patient continues to practice HEP at home with heat and it does help diminish pain in the L leg but pain is still present.   Discharge Findings: Did not assess due to no-show and did not contact rehab services   Functional Status at Discharge: Did not assess  No Goals Met  SHORT TERM GOALS: Target date: 08/02/2022  Patient will demonstrate independence with HEP in order to maximize therapeutic gains and improve carryover from physical therapy sessions to ADLs in the home and community. Baseline: toileting posture and breathing Goal status:  INITIAL    LONG TERM GOALS: Target date: 09/06/2022   Patient will demonstrate coordinated lengthening and relaxation of PFM with diaphragmatic inhalation in order to decrease spasm and allow for unrestricted elimination of urine/feces for improved overall QOL. Baseline: Pt with hx of straining for BMs and using Miralax (Type 1 Bristol Stool Chart) Goal status: INITIAL  2.  Patient will decrease worst pain as reported on NPRS by at least 2 points to demonstrate clinically significant reduction in pain in order to restore/improve function and overall QOL. Baseline: 9/10 (LLQ)  Goal status: INITIAL  3.  Patient will report being able to return to activities including, but not limited to: sitting for long periods, lifting, bending, intercourse, increased physical activity without pain or limitation to indicate complete resolution of the chief concern and return to prior level of participation at home and in the community. Baseline: Pain in LLQ with all the above and Pt limits herself on what she can do because of pain  Goal status: INITIAL  4.  Patient will improve FOTO Pain/Lumbar Spine score by at least 8 points in order to demonstrate decreased pain, improved PFM coordination, improved tissue extensibility and overall QOL.  Baseline: will assess next session  Goal status: INITIAL  5.  Patient will be able to demonstrate and verbalize scar mobilization techniques and myofascial abdominal release techniques paired with diaphragmatic breathing in order to aid in decreasing pain and downregulating the nervous system.  Baseline: will educate in future sessions Goal status: INITIAL   Sincerely,  Filbert Berthold, PT, DPT    CC No Recipients  [place] [facility] SERVICES [address] [place], [place], [address] Phone: [phone]   Fax:  [phone]  Patient: Shelby Montgomery  MRN: [id number]  Date of Birth: 1986/06/14

## 2022-08-23 ENCOUNTER — Ambulatory Visit: Payer: BC Managed Care – PPO

## 2022-08-31 ENCOUNTER — Other Ambulatory Visit: Payer: Self-pay | Admitting: Primary Care

## 2022-08-31 ENCOUNTER — Ambulatory Visit
Admission: RE | Admit: 2022-08-31 | Discharge: 2022-08-31 | Disposition: A | Discharge status: Home / Self Care | Payer: BC Managed Care – PPO | Attending: Primary Care | Admitting: Primary Care

## 2022-08-31 ENCOUNTER — Ambulatory Visit
Admission: RE | Admit: 2022-08-31 | Discharge: 2022-08-31 | Disposition: A | Discharge status: Home / Self Care | Payer: BC Managed Care – PPO | Source: Ambulatory Visit | Attending: Primary Care | Admitting: Primary Care

## 2022-08-31 DIAGNOSIS — M5442 Lumbago with sciatica, left side: Secondary | ICD-10-CM

## 2022-08-31 DIAGNOSIS — M533 Sacrococcygeal disorders, not elsewhere classified: Secondary | ICD-10-CM | POA: Diagnosis not present

## 2022-08-31 DIAGNOSIS — M5440 Lumbago with sciatica, unspecified side: Secondary | ICD-10-CM | POA: Diagnosis not present

## 2022-08-31 DIAGNOSIS — Z3009 Encounter for other general counseling and advice on contraception: Secondary | ICD-10-CM | POA: Diagnosis not present

## 2022-09-08 DIAGNOSIS — M5416 Radiculopathy, lumbar region: Secondary | ICD-10-CM | POA: Diagnosis not present

## 2022-09-27 DIAGNOSIS — M5416 Radiculopathy, lumbar region: Secondary | ICD-10-CM | POA: Diagnosis not present

## 2022-09-28 DIAGNOSIS — M5416 Radiculopathy, lumbar region: Secondary | ICD-10-CM | POA: Diagnosis not present

## 2022-10-12 DIAGNOSIS — M5416 Radiculopathy, lumbar region: Secondary | ICD-10-CM | POA: Diagnosis not present

## 2022-11-21 IMAGING — US US PELVIS COMPLETE WITH TRANSVAGINAL
1 series · 13 of 25 positions shown · non-contrast
Comparison: Pelvic ultrasound 12/16/2019

CLINICAL DATA: Right lower quadrant pain

EXAM:
TRANSABDOMINAL AND TRANSVAGINAL ULTRASOUND OF PELVIS
TECHNIQUE: Both transabdominal and transvaginal ultrasound examinations of the
pelvis were performed. Transabdominal technique was performed for
global imaging of the pelvis including uterus, ovaries, adnexal
regions, and pelvic cul-de-sac. It was necessary to proceed with
endovaginal exam following the transabdominal exam to visualize the
uterus endometrium ovaries.

[Series 1: us pelvic complete with transvaginal · 75 acquisitions, 13 frames shown]
[im 1/75]
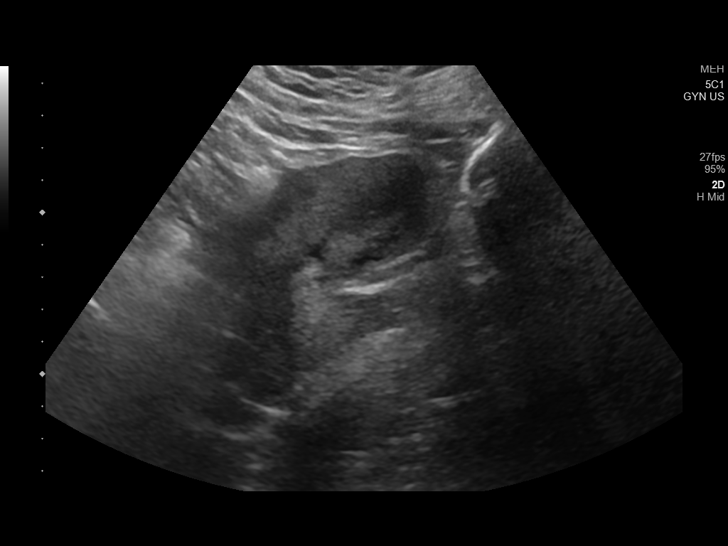
[im 7/75]
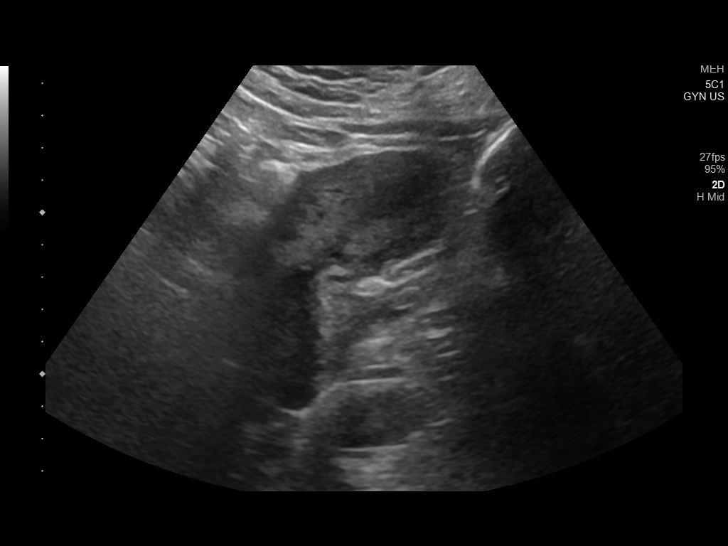
[im 13/75]
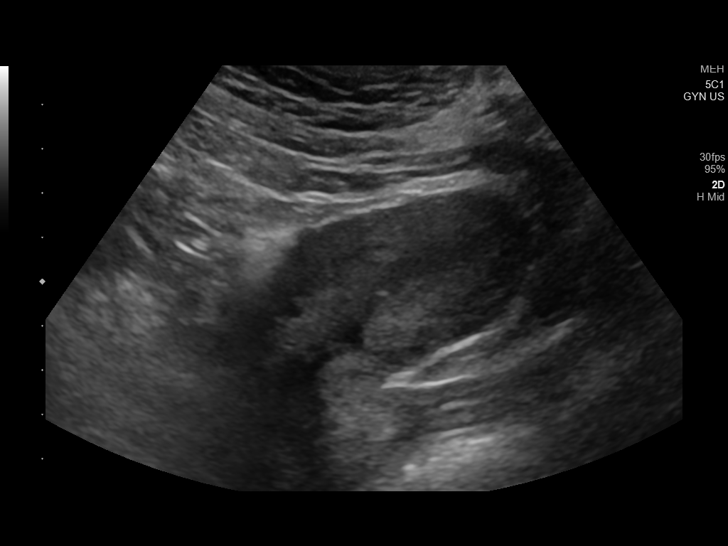
[im 19/75]
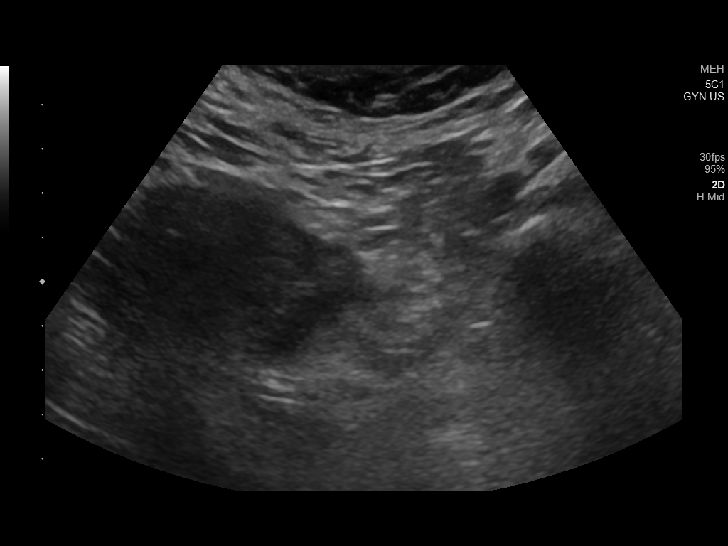
[im 25/75]
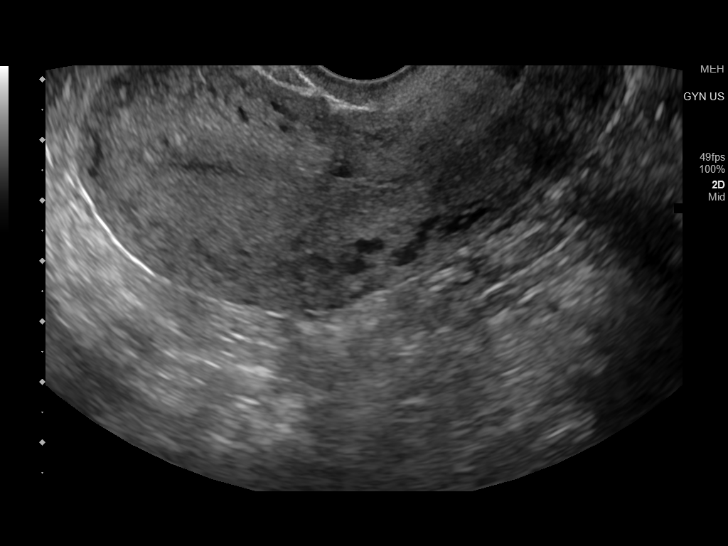
[im 31/75]
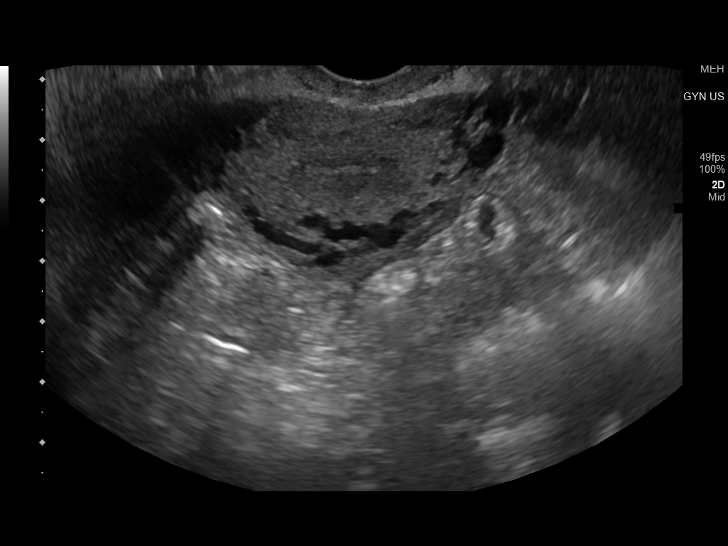
[im 38/75]
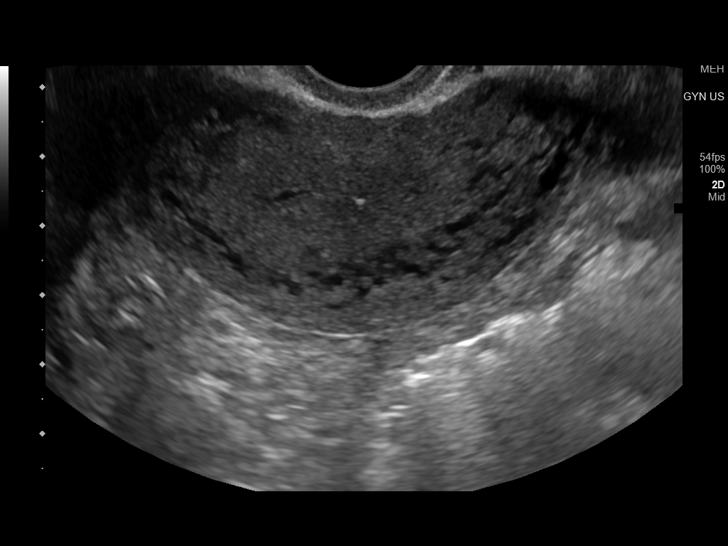
[im 44/75]
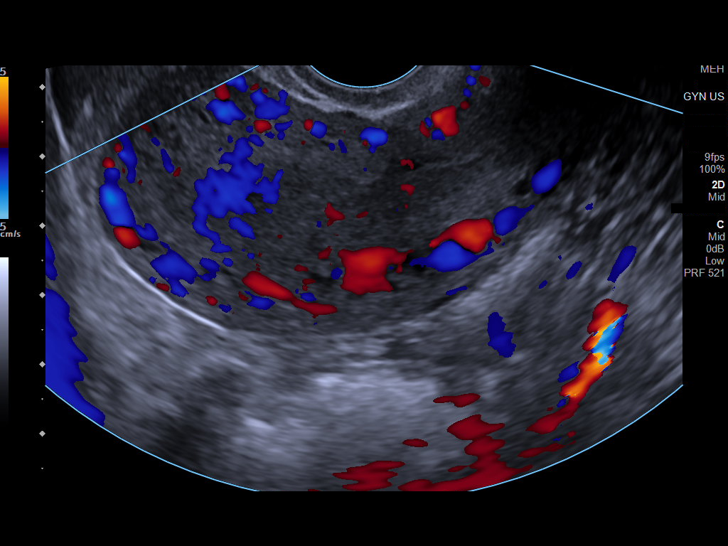
[im 50/75]
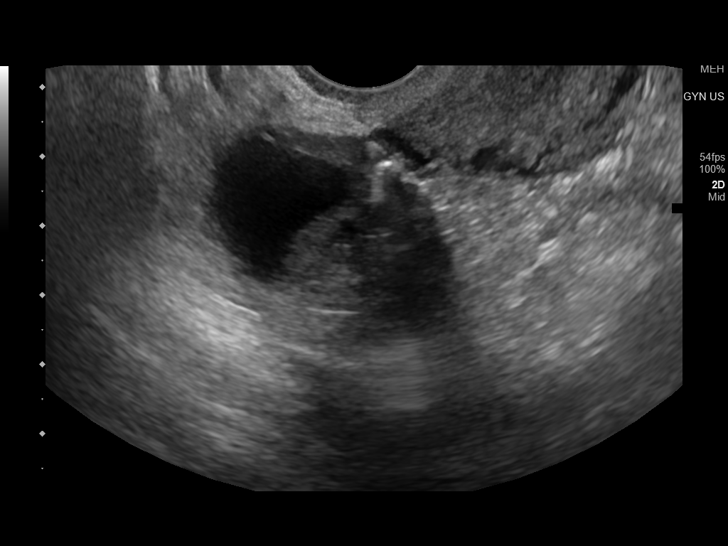
[im 56/75]
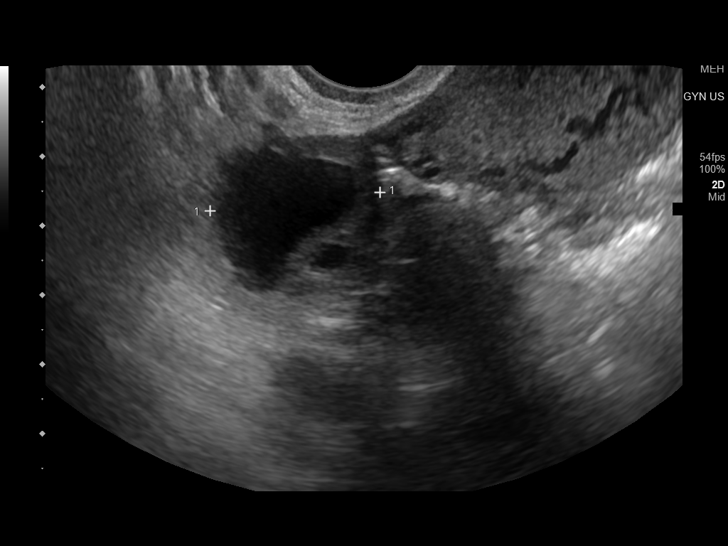
[im 62/75]
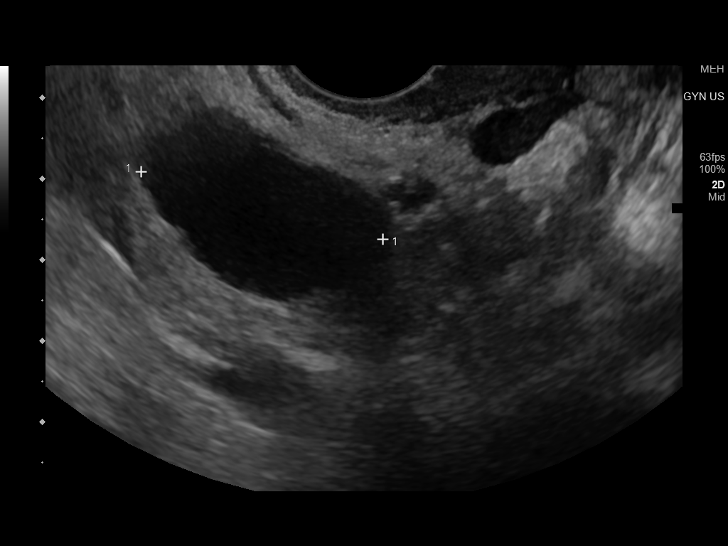
[im 68/75]
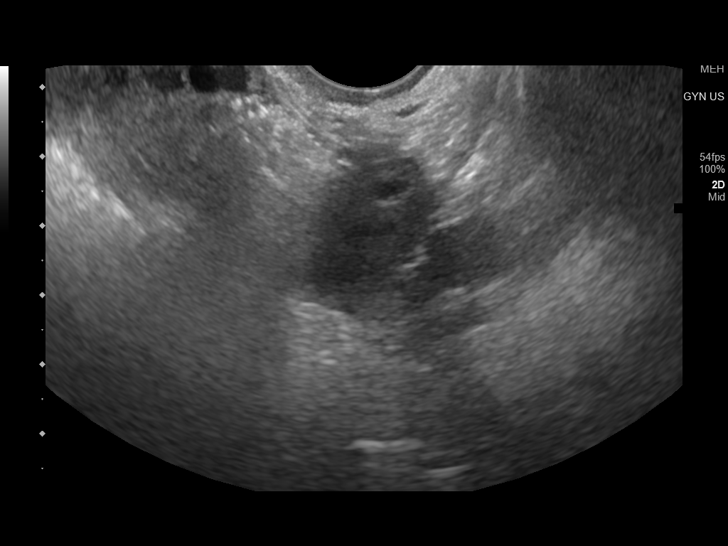
[im 75/75]
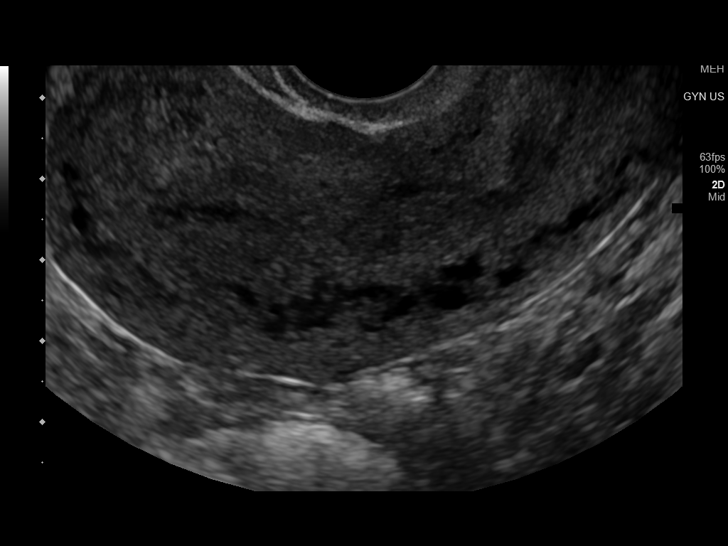

[13 of 25 positions shown; findings below may reference images not displayed]

FINDINGS: Uterus

Measurements: 9 x 3.9 x 5.9 cm = volume: 107.1 mL. No fibroids or
other mass visualized.

Endometrium

Thickness: 1.6 mm.  No focal abnormality visualized.

Right ovary

Measurements: 3.3 x 2.8 x 2.5 cm = volume: 11.7 mL. Cyst measuring
3.1 x 2.1 x 1.2 cm

Left ovary

Measurements: 3.1 x 2 x 1.6 cm = volume: 5.3 mL. Normal
appearance/no adnexal mass.

Other findings

No abnormal free fluid.
IMPRESSION: 1. 3.1 cm right ovarian cyst. No follow up imaging recommended.
Note: This recommendation does not apply to premenarchal patients or
to those with increased risk (genetic, family history, elevated
tumor markers or other high-risk factors) of ovarian cancer.
Reference: Radiology [DATE]):[id number].
2. Otherwise negative pelvic ultrasound

## 2023-02-06 DIAGNOSIS — M7662 Achilles tendinitis, left leg: Secondary | ICD-10-CM | POA: Diagnosis not present

## 2023-02-20 DIAGNOSIS — M7662 Achilles tendinitis, left leg: Secondary | ICD-10-CM | POA: Diagnosis not present

## 2023-02-20 DIAGNOSIS — M5416 Radiculopathy, lumbar region: Secondary | ICD-10-CM | POA: Diagnosis not present

## 2023-03-03 DIAGNOSIS — M5416 Radiculopathy, lumbar region: Secondary | ICD-10-CM | POA: Diagnosis not present

## 2023-03-15 ENCOUNTER — Other Ambulatory Visit: Payer: Self-pay | Admitting: Orthopedic Surgery

## 2023-03-15 DIAGNOSIS — M5416 Radiculopathy, lumbar region: Secondary | ICD-10-CM

## 2023-03-17 DIAGNOSIS — M5416 Radiculopathy, lumbar region: Secondary | ICD-10-CM | POA: Diagnosis not present

## 2023-03-23 ENCOUNTER — Ambulatory Visit: Payer: BC Managed Care – PPO

## 2023-03-31 ENCOUNTER — Ambulatory Visit
Admission: RE | Admit: 2023-03-31 | Discharge: 2023-03-31 | Disposition: A | Discharge status: Home / Self Care | Payer: Medicaid Other | Source: Ambulatory Visit | Attending: Orthopedic Surgery | Admitting: Orthopedic Surgery

## 2023-03-31 DIAGNOSIS — M5416 Radiculopathy, lumbar region: Secondary | ICD-10-CM | POA: Insufficient documentation

## 2023-11-14 ENCOUNTER — Other Ambulatory Visit: Payer: Self-pay | Admitting: Family Medicine

## 2023-11-14 DIAGNOSIS — N644 Mastodynia: Secondary | ICD-10-CM

## 2023-11-14 DIAGNOSIS — Z1231 Encounter for screening mammogram for malignant neoplasm of breast: Secondary | ICD-10-CM

## 2023-12-08 ENCOUNTER — Ambulatory Visit
Admission: RE | Admit: 2023-12-08 | Discharge: 2023-12-08 | Disposition: A | Discharge status: Home / Self Care | Source: Ambulatory Visit | Attending: Family Medicine | Admitting: Family Medicine

## 2023-12-08 DIAGNOSIS — N644 Mastodynia: Secondary | ICD-10-CM | POA: Diagnosis present

## 2023-12-08 DIAGNOSIS — Z1231 Encounter for screening mammogram for malignant neoplasm of breast: Secondary | ICD-10-CM | POA: Diagnosis present
